# Patient Record
Sex: Male | Born: 1960 | Race: Black or African American | Hispanic: No | Smoking: Current every day smoker
Health system: Southern US, Community
[De-identification: ages and names within clinical notes are randomized; demographics above are authoritative.]

## PROBLEM LIST (undated history)

## (undated) DIAGNOSIS — K259 Gastric ulcer, unspecified as acute or chronic, without hemorrhage or perforation: Secondary | ICD-10-CM

---

## 2016-07-18 ENCOUNTER — Emergency Department (HOSPITAL_COMMUNITY)
Admission: EM | Admit: 2016-07-18 | Discharge: 2016-07-18 | Disposition: A | Discharge status: Home / Self Care | Payer: Self-pay | Attending: Emergency Medicine | Admitting: Emergency Medicine

## 2016-07-18 ENCOUNTER — Encounter (HOSPITAL_COMMUNITY): Payer: Self-pay | Admitting: Emergency Medicine

## 2016-07-18 ENCOUNTER — Emergency Department (HOSPITAL_COMMUNITY): Payer: Self-pay

## 2016-07-18 DIAGNOSIS — S43401A Unspecified sprain of right shoulder joint, initial encounter: Secondary | ICD-10-CM | POA: Insufficient documentation

## 2016-07-18 DIAGNOSIS — W108XXA Fall (on) (from) other stairs and steps, initial encounter: Secondary | ICD-10-CM | POA: Insufficient documentation

## 2016-07-18 DIAGNOSIS — Y999 Unspecified external cause status: Secondary | ICD-10-CM | POA: Insufficient documentation

## 2016-07-18 DIAGNOSIS — Y9289 Other specified places as the place of occurrence of the external cause: Secondary | ICD-10-CM | POA: Insufficient documentation

## 2016-07-18 DIAGNOSIS — Y9389 Activity, other specified: Secondary | ICD-10-CM | POA: Insufficient documentation

## 2016-07-18 HISTORY — DX: Gastric ulcer, unspecified as acute or chronic, without hemorrhage or perforation: K25.9

## 2016-07-18 NOTE — ED Provider Notes (Signed)
[facility]-EMERGENCY DEPT Provider Note   CSN: [id number] Arrival date & time: 07/18/16  1046  By signing my name below, I, Rosana Fret, attest that this documentation has been prepared under the direction and in the presence of non-physician practitioner, Jaynie Crumble, PA-C. Electronically Signed: Rosana Fret, ED Scribe. 07/18/16. 1:09 PM.  History   Chief Complaint Chief Complaint  Patient presents with  . Fall   The history is provided by the patient. No language interpreter was used.   HPI Comments: Tristan Johnson is a 56 y.o. male who presents to the Emergency Department complaining of a sudden onset, mechanical fall that occurred just prior to arrival. No LOC or head injury. 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 Pt's only complaint is right shoulder pain. Per pt, his pain is exacerbated by movement of his right arm above his head. No treatments tried prior to arrival in the ED. 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 No other complaints at this time. Not anticoagulated. States was able to finish moving the dresser after injury.   Past Medical History:  Diagnosis Date  . Gastric ulcer     There are no active problems to display for this patient.   History reviewed. No pertinent surgical history.     Home Medications    Prior to Admission medications   Not on File    Family History No family history on file.  Social History Social History  Substance Use Topics  . Smoking status: Not on file  . Smokeless tobacco: Not on file  . Alcohol use Not on file     Allergies   Ibuprofen   Review of Systems Review of Systems  Musculoskeletal: Positive for arthralgias and myalgias. Negative for back pain and neck pain.  Neurological: Negative for dizziness and headaches.  All other systems reviewed and are negative.    Physical Exam Updated Vital Signs BP 139/87 (BP Location: Left Arm)   Pulse 64    Temp 98.2 F (36.8 C) (Oral)   Resp 17   SpO2 97%   Physical Exam  Constitutional: He is oriented to person, place, and time. He appears well-developed and well-nourished.  HENT:  Head: Normocephalic and atraumatic.  Neck: Normal range of motion. Neck supple.  Cardiovascular: Normal rate.   Pulmonary/Chest: Effort normal.  Musculoskeletal: Normal range of motion.  Normal appearing right shoulder with no deformity. Superficial abrasion to the upper posterior arm. Pain with palaption over posterior shoulder joint. No tenderness over clavicle, acromion, anterior shoulder. Normal elbow. No ttp over tricep or bicep. Strength of tricep and bicep intact. Pain with ROM of shoulder. Distal radial pulses intact  Neurological: He is alert and oriented to person, place, and time.  Skin: Skin is warm and dry.  Psychiatric: He has a normal mood and affect.  Nursing note and vitals reviewed.    ED Treatments / Results  DIAGNOSTIC STUDIES: Oxygen Saturation is 97% on RA, normal by my interpretation.   COORDINATION OF CARE: 1:07 PM-Discussed next steps with pt including rest, ice the area and follow up with ortho if it does not improve. Pt verbalized understanding and is agreeable with the plan.   Labs (all labs ordered are listed, but only abnormal results are displayed) Labs Reviewed - No data to display  EKG  EKG Interpretation None       Radiology Dg Humerus Right  Result Date: 07/18/2016 CLINICAL DATA:  56 year old male status  post fall down flight of stairs. Right arm pain. EXAM: RIGHT HUMERUS - 2+ VIEW COMPARISON:  None. FINDINGS: Normal bone mineralization. Alignment about the right shoulder and elbow is preserved. The right humerus is intact. No acute osseous abnormality identified. Incidental right chest wall piercing. IMPRESSION: No acute fracture or dislocation identified about the right humerus. Electronically Signed   By: Odessa FlemingH  Hall M.D.   On: 07/18/2016 11:40     Procedures Procedures (including critical care time)  Medications Ordered in ED Medications - No data to display   Initial Impression / Assessment and Plan / ED Course  I have reviewed the triage vital signs and the nursing notes.  Pertinent labs & imaging results that were available during my care of the patient were reviewed by me and considered in my medical decision making (see chart for details).     Patient in emergency department after falling down several steps. He is only complaining of pain to the right shoulder. He did not hit his head or lost consciousness. He has no pain to his neck or back. He is not anticoagulated. No numbness or weakness down his right arm. X-Elmus is negative. We'll place him in a sling, home with NSAIDs, ice, rest, follow-up with orthopedics if not improving. Return precautions discussed.  Vitals:   07/18/16 1054 07/18/16 1302  BP: 139/87 (!) 138/93  Pulse: 64   Resp: 17   Temp: 98.2 F (36.8 C)   TempSrc: Oral   SpO2: 97%      Final Clinical Impressions(s) / ED Diagnoses   Final diagnoses:  Sprain of right shoulder, unspecified shoulder sprain type, initial encounter    New Prescriptions New Prescriptions   No medications on file   I personally performed the services described in this documentation, which was scribed in my presence. The recorded information has been reviewed and is accurate.    Jaynie CrumbleKirichenko, Pablo Mathurin, PA-C 07/18/16 1332    Tilden Fossaees, Elizabeth, MD 07/19/16 [phone]

## 2016-07-18 NOTE — Discharge Instructions (Signed)
Ice, rest, sling as needed. Tylenol for pain. Follow up with orthopedics as needed.

## 2016-07-18 NOTE — ED Notes (Addendum)
Sling applied to R arm

## 2016-07-18 NOTE — ED Triage Notes (Addendum)
Pt reports falling down a flight of stairs when the dolly he was using slipped. C/o right upper arm pain. Unable to lift shoulder, no obvious deformity noted. Pt denies hitting his head or passing out.

## 2019-02-06 ENCOUNTER — Encounter (HOSPITAL_COMMUNITY): Payer: Self-pay | Admitting: Emergency Medicine

## 2019-02-06 ENCOUNTER — Emergency Department (HOSPITAL_COMMUNITY): Payer: Self-pay

## 2019-02-06 ENCOUNTER — Other Ambulatory Visit: Payer: Self-pay

## 2019-02-06 ENCOUNTER — Emergency Department (HOSPITAL_COMMUNITY)
Admission: EM | Admit: 2019-02-06 | Discharge: 2019-02-06 | Disposition: A | Discharge status: Home / Self Care | Payer: Self-pay | Attending: Emergency Medicine | Admitting: Emergency Medicine

## 2019-02-06 DIAGNOSIS — Z79899 Other long term (current) drug therapy: Secondary | ICD-10-CM | POA: Insufficient documentation

## 2019-02-06 DIAGNOSIS — F17210 Nicotine dependence, cigarettes, uncomplicated: Secondary | ICD-10-CM | POA: Insufficient documentation

## 2019-02-06 DIAGNOSIS — N201 Calculus of ureter: Secondary | ICD-10-CM | POA: Insufficient documentation

## 2019-02-06 LAB — URINALYSIS, ROUTINE W REFLEX MICROSCOPIC
Bacteria, UA: NONE SEEN
Bilirubin Urine: NEGATIVE
Glucose, UA: NEGATIVE mg/dL
Ketones, ur: NEGATIVE mg/dL
Leukocytes,Ua: NEGATIVE
Nitrite: NEGATIVE
Protein, ur: 30 mg/dL — AB
RBC / HPF: >50 RBC/hpf — ABNORMAL HIGH (ref 0–5)
Specific Gravity, Urine: 1.018 (ref 1.005–1.030)
pH: 6 (ref 5.0–8.0)

## 2019-02-06 LAB — COMPREHENSIVE METABOLIC PANEL
ALT: 13 U/L (ref 0–44)
AST: 18 U/L (ref 15–41)
Albumin: 4.1 g/dL (ref 3.5–5.0)
Alkaline Phosphatase: 72 U/L (ref 38–126)
Anion gap: 12 (ref 5–15)
BUN: 10 mg/dL (ref 6–20)
CO2: 22 mmol/L (ref 22–32)
Calcium: 8.8 mg/dL — ABNORMAL LOW (ref 8.9–10.3)
Chloride: 107 mmol/L (ref 98–111)
Creatinine, Ser: 1.05 mg/dL (ref 0.61–1.24)
GFR calc Af Amer: >60 mL/min (ref >60)
GFR calc non Af Amer: >60 mL/min (ref >60)
Glucose, Bld: 129 mg/dL — ABNORMAL HIGH (ref 70–99)
Potassium: 4 mmol/L (ref 3.5–5.1)
Sodium: 141 mmol/L (ref 135–145)
Total Bilirubin: 0.7 mg/dL (ref 0.3–1.2)
Total Protein: 8 g/dL (ref 6.5–8.1)

## 2019-02-06 LAB — CBC
HCT: 43.7 % (ref 39.0–52.0)
Hemoglobin: 14.3 g/dL (ref 13.0–17.0)
MCH: 30.8 pg (ref 26.0–34.0)
MCHC: 32.7 g/dL (ref 30.0–36.0)
MCV: 94.2 fL (ref 80.0–100.0)
Platelets: 309 10*3/uL (ref 150–400)
RBC: 4.64 MIL/uL (ref 4.22–5.81)
RDW: 12.7 % (ref 11.5–15.5)
WBC: 9.4 10*3/uL (ref 4.0–10.5)
nRBC: 0 % (ref 0.0–0.2)

## 2019-02-06 LAB — LIPASE, BLOOD: Lipase: 23 U/L (ref 11–51)

## 2019-02-06 MED ORDER — SODIUM CHLORIDE 0.9% FLUSH: 3.0000 mL | Freq: Once | INTRAVENOUS | Status: DC

## 2019-02-06 MED ORDER — IOHEXOL 300 MG/ML  SOLN
100.0000 mL | Freq: Once | INTRAMUSCULAR | Status: AC | PRN
  Administered 2019-02-06: 12:00:00 100 mL via INTRAVENOUS

## 2019-02-06 MED ORDER — OXYCODONE-ACETAMINOPHEN 5-325 MG PO TABS: 1.0000 | ORAL_TABLET | Freq: Four times a day (QID) | ORAL | 0 refills | Status: DC | PRN

## 2019-02-06 MED ORDER — HYDROMORPHONE HCL 1 MG/ML IJ SOLN
1.0000 mg | Freq: Once | INTRAMUSCULAR | Status: DC
  Filled 2019-02-06: qty 1

## 2019-02-06 MED ORDER — MORPHINE SULFATE (PF) 4 MG/ML IV SOLN
4.0000 mg | Freq: Once | INTRAVENOUS | Status: AC
  Administered 2019-02-06: 4 mg via INTRAVENOUS
  Filled 2019-02-06: qty 1

## 2019-02-06 MED ORDER — ONDANSETRON HCL 4 MG/2ML IJ SOLN
4.0000 mg | Freq: Once | INTRAMUSCULAR | Status: AC
Start: 2019-02-06 — End: 2019-02-06
  Administered 2019-02-06: 14:00:00 4 mg via INTRAVENOUS
  Filled 2019-02-06: qty 2

## 2019-02-06 MED ORDER — TAMSULOSIN HCL 0.4 MG PO CAPS: 0.4000 mg | ORAL_CAPSULE | Freq: Every day | ORAL | 0 refills | Status: DC

## 2019-02-06 MED ORDER — OXYCODONE-ACETAMINOPHEN 5-325 MG PO TABS
2.0000 | ORAL_TABLET | Freq: Once | ORAL | Status: AC
  Administered 2019-02-06: 2 via ORAL
  Filled 2019-02-06: qty 2

## 2019-02-06 MED ORDER — ONDANSETRON HCL 4 MG/2ML IJ SOLN
4.0000 mg | Freq: Once | INTRAMUSCULAR | Status: AC
  Administered 2019-02-06: 4 mg via INTRAVENOUS
  Filled 2019-02-06: qty 2

## 2019-02-06 MED ORDER — SODIUM CHLORIDE 0.9 % IV BOLUS
1000.0000 mL | Freq: Once | INTRAVENOUS | Status: AC
  Administered 2019-02-06: 10:00:00 1000 mL via INTRAVENOUS

## 2019-02-06 MED ORDER — ONDANSETRON 8 MG PO TBDP: 8.0000 mg | ORAL_TABLET | Freq: Three times a day (TID) | ORAL | 0 refills | Status: DC | PRN

## 2019-02-06 NOTE — ED Triage Notes (Signed)
Pt c/o LLQ pain that started yesterday after eating. Denies nausea/vomiting/diarrhea. Also c/o blood in urine.

## 2019-02-06 NOTE — Discharge Instructions (Addendum)
°  Kidney Stone There is evidence of a kidney stone on the left side.  It appears as though it is on its way out.  Some kidney stones can take up to 30 days to pass, however, some are too large to pass. Hydration: Hydration is key to helping a kidney stone pass.  Have a goal of half a liter of water every hour or two. Acetaminophen: May take acetaminophen (generic for Tylenol), as needed, for pain. Your daily total maximum amount of acetaminophen from all sources should be limited to 4000mg /day for persons without liver problems, or 2000mg /day for those with liver problems. Percocet: May take Percocet (oxycodone-acetaminophen) as needed for severe pain.   Do not drive or perform other dangerous activities while taking this medication as it can cause drowsiness as well as changes in reaction time and judgement.  Please note that each pill of Percocet contains 325 mg of acetaminophen (generic for Tylenol) and the above dosage limits apply. Tamsulosin: This medication is designed to help the stone pass.  Take this medication daily until stone passes. Nausea/vomiting: Use the ondansetron (generic for Zofran) for nausea or vomiting.  This medication may not prevent all vomiting or nausea, but can help facilitate better hydration. Things that can help with nausea/vomiting also include peppermint/menthol candies, vitamin B12, and ginger. Follow-up: Follow-up with the urologist as soon as possible on this matter. Return: Return to the ED for significantly increased pain, difficulty urinating, pain with urination, fever, uncontrolled vomiting, or any other major concerns.  For prescription assistance, may try using prescription discount sites or apps, such as goodrx.com

## 2019-02-06 NOTE — ED Notes (Signed)
Pt stated cannot provide urine sample at this time.

## 2019-02-06 NOTE — ED Provider Notes (Signed)
[facility] EMERGENCY DEPARTMENT Provider Note   CSN: [id number] Arrival date & time: 02/06/19  0347     History Chief Complaint  Patient presents with  . Abdominal Pain    Tristan Johnson is a 58 y.o. male.  HPI     Tristan Johnson is a 58 y.o. male, with a history of PUD, presenting to the ED with abdominal pain beginning last night.  Patient states while eating he began to have sudden, stabbing pain to the left side of the abdomen, severe, radiating to the pelvis and toward the left testicle.  Accompanied by nausea and vomiting when pain arises.  Pain lasted for couple hours and then subsided. He had at least 2 or 3 more episodes of this pattern of pain overnight and into this morning.  He states he has had pain in this region before, but the pain will typically subside after taking Nexium. This morning he also noted one episode of pink-tinged urine. Last bowel movement was yesterday morning and was normal. Denies regular alcohol use, NSAID use.  Denies anticoagulation.  Denies fever/chills, hematemesis, hematochezia/melena, diarrhea, dysuria, persistent genital pain, genital swelling, chest pain, shortness of breath, neuro deficits, or any other complaints.   Past Medical History:  Diagnosis Date  . Gastric ulcer     There are no problems to display for this patient.   History reviewed. No pertinent surgical history.     No family history on file.  Social History   Tobacco Use  . Smoking status: Current Every Day Smoker  . Smokeless tobacco: Never Used  Substance Use Topics  . Alcohol use: Yes  . Drug use: Not Currently    Home Medications Prior to Admission medications   Medication Sig Start Date End Date Taking? Authorizing Provider  esomeprazole (NEXIUM) 20 MG capsule Take 20 mg by mouth daily at 12 noon.   Yes [provider]  ondansetron (ZOFRAN ODT) 8 MG disintegrating tablet Take 1 tablet (8 mg total) by mouth every 8 (eight)  hours as needed for nausea or vomiting. 02/06/19   Oliver Neuwirth C, PA-C  oxyCODONE-acetaminophen (PERCOCET/ROXICET) 5-325 MG tablet Take 1-2 tablets by mouth every 6 (six) hours as needed for severe pain. 02/06/19   Shayma Pfefferle C, PA-C  tamsulosin (FLOMAX) 0.4 MG CAPS capsule Take 1 capsule (0.4 mg total) by mouth daily. 02/06/19   Jaydeen Odor C, PA-C    Allergies    Ibuprofen  Review of Systems   Review of Systems  Constitutional: Negative for chills, diaphoresis and fever.  Respiratory: Negative for cough and shortness of breath.   Cardiovascular: Negative for chest pain and leg swelling.  Gastrointestinal: Positive for abdominal pain, nausea and vomiting. Negative for blood in stool and diarrhea.  Genitourinary: Positive for flank pain and hematuria. Negative for difficulty urinating, discharge, dysuria, scrotal swelling and testicular pain.  Musculoskeletal: Negative for back pain.  Neurological: Negative for dizziness, weakness, light-headedness and numbness.  All other systems reviewed and are negative.   Physical Exam Updated Vital Signs BP (!) 159/95 (BP Location: Left Arm)   Pulse 73   Temp 98.4 F (36.9 C) (Oral)   Resp 18   SpO2 100%   Physical Exam Vitals and nursing note reviewed.  Constitutional:      General: He is not in acute distress.    Appearance: He is well-developed. He is not diaphoretic.  HENT:     Head: Normocephalic and atraumatic.     Mouth/Throat:  Mouth: Mucous membranes are moist.     Pharynx: Oropharynx is clear.  Eyes:     Conjunctiva/sclera: Conjunctivae normal.  Cardiovascular:     Rate and Rhythm: Normal rate and regular rhythm.     Pulses: Normal pulses.          Radial pulses are 2+ on the right side and 2+ on the left side.       Posterior tibial pulses are 2+ on the right side and 2+ on the left side.     Heart sounds: Normal heart sounds.     Comments: Tactile temperature in the extremities appropriate and equal  bilaterally. Pulmonary:     Effort: Pulmonary effort is normal. No respiratory distress.     Breath sounds: Normal breath sounds.  Abdominal:     Palpations: Abdomen is soft.     Tenderness: There is abdominal tenderness. There is no right CVA tenderness, left CVA tenderness or guarding.    Musculoskeletal:     Cervical back: Neck supple.     Right lower leg: No edema.     Left lower leg: No edema.  Lymphadenopathy:     Cervical: No cervical adenopathy.  Skin:    General: Skin is warm and dry.  Neurological:     Mental Status: He is alert.     Comments: Sensation grossly intact to light touch in the extremities.  Grip strengths equal bilaterally.  Strength 5/5 in all extremities. No gait disturbance. Coordination intact. Cranial nerves III-XII grossly intact. No facial droop.   Psychiatric:        Mood and Affect: Mood and affect normal.        Speech: Speech normal.        Behavior: Behavior normal.     ED Results / Procedures / Treatments   Labs (all labs ordered are listed, but only abnormal results are displayed) Labs Reviewed  COMPREHENSIVE METABOLIC PANEL - Abnormal; Notable for the following components:      Result Value   Glucose, Bld 129 (*)    Calcium 8.8 (*)    All other components within normal limits  URINALYSIS, ROUTINE W REFLEX MICROSCOPIC - Abnormal; Notable for the following components:   APPearance HAZY (*)    Hgb urine dipstick LARGE (*)    Protein, ur 30 (*)    RBC / HPF >50 (*)    All other components within normal limits  URINE CULTURE  LIPASE, BLOOD  CBC    EKG None  Radiology CT ABDOMEN PELVIS W CONTRAST  Result Date: 02/06/2019 CLINICAL DATA:  Abdominal pain. Nausea and vomiting. Left lower quadrant and left flank pain. EXAM: CT ABDOMEN AND PELVIS WITH CONTRAST TECHNIQUE: Multidetector CT imaging of the abdomen and pelvis was performed using the standard protocol following bolus administration of intravenous contrast. CONTRAST:   OMNIPAQUE IOHEXOL 300 MG/ML  SOLN COMPARISON:  None. FINDINGS: Lower chest: Unremarkable. Hepatobiliary: 10 mm low-density lesion lateral segment left liver is compatible with a cyst. Several other scattered tiny hepatic hypodensities are too small to characterize. There is no evidence for gallstones, gallbladder wall thickening, or pericholecystic fluid. No intrahepatic or extrahepatic biliary dilation. Pancreas: No focal mass lesion. No dilatation of the main duct. No intraparenchymal cyst. No peripancreatic edema. Spleen: No splenomegaly. No focal mass lesion. Adrenals/Urinary Tract: No adrenal nodule or mass. 4.1 cm cyst identified upper pole right kidney. Right ureter unremarkable. Differentially decreased perfusion noted to the left kidney. Delayed contrast excretion noted left kidney. 2.1 cm  cyst identified lower pole left kidney. Additional small hypodensities in the left kidney cannot be definitively characterized but are probably cysts. Mild fullness noted left intrarenal collecting system and ureter. Left ureter remains mildly distended down to the level of the left internal iliac artery where a 12 x 6 x 4 mm ureteral stone is identified (axial 57/3 and well demonstrated sagittal 72/7). No bladder stones. Stomach/Bowel: Stomach is unremarkable. No gastric wall thickening. No evidence of outlet obstruction. Duodenum is normally positioned as is the ligament of Treitz. No small bowel wall thickening. No small bowel dilatation. The terminal ileum is normal. The appendix is normal. No gross colonic mass. No colonic wall thickening. Diverticular changes are noted in the left colon without evidence of diverticulitis. Vascular/Lymphatic: There is abdominal aortic atherosclerosis without aneurysm. There is no gastrohepatic or hepatoduodenal ligament lymphadenopathy. No retroperitoneal or mesenteric lymphadenopathy. No pelvic sidewall lymphadenopathy. Reproductive: The prostate gland and seminal vesicles are  unremarkable. Other: No intraperitoneal free fluid. Musculoskeletal: No worrisome lytic or sclerotic osseous abnormality. Degenerative disc disease noted L5-S1. IMPRESSION: 1. 12 x 6 x 4 mm distal left ureteral stone causes mild left hydroureteronephrosis with evidence of obstructive uropathy. No other urinary stone disease. 2. Hepatic and renal cysts. Electronically Signed   By: Kennith CenterEric  Mansell M.D.   On: 02/06/2019 12:40    Procedures Procedures (including critical care time)  Medications Ordered in ED Medications  sodium chloride flush (NS) 0.9 % injection 3 mL (3 mLs Intravenous Not Given 02/06/19 1012)  sodium chloride 0.9 % bolus 1,000 mL (0 mLs Intravenous Stopped 02/06/19 1050)  ondansetron (ZOFRAN) injection 4 mg (4 mg Intravenous Given 02/06/19 1222)  iohexol (OMNIPAQUE) 300 MG/ML solution 100 mL (100 mLs Intravenous Contrast Given 02/06/19 1159)  morphine 4 MG/ML injection 4 mg (4 mg Intravenous Given 02/06/19 1223)  ondansetron (ZOFRAN) injection 4 mg (4 mg Intravenous Given 02/06/19 1406)  oxyCODONE-acetaminophen (PERCOCET/ROXICET) 5-325 MG per tablet 2 tablet (2 tablets Oral Given 02/06/19 1416)    ED Course  I have reviewed the triage vital signs and the nursing notes.  Pertinent labs & imaging results that were available during my care of the patient were reviewed by me and considered in my medical decision making (see chart for details).  Clinical Course as of Feb 05 1506  Sat Feb 06, 2019  1351 Spoke with Dr. Ronne BinningMcKenzie, urologist.  Discussed patient's symptoms and CT findings.  States they can see the patient in the office if we are able to get his pain under control. Call back if unable to do so.   [SJ]  1503 Patient continues to be comfortable with pain well controlled.  He was able to tolerate drinking fluids without vomiting or onset of nausea.   [SJ]    Clinical Course User Index [SJ] Reily Ilic C, PA-C   MDM Rules/Calculators/A&P                      Patient  presents with flank and abdominal pain beginning last night.  Accompanied by nausea and vomiting. Patient is nontoxic appearing, afebrile, not tachycardic, not tachypneic, not hypotensive, maintains excellent SPO2 on room air. Lab work overall reassuring.  12 mm left distal ureteral stone noted on CT. Pain and nausea well controlled here in the ED.  Tolerating PO fluids at time of discharge.  Urology follow-up in the office. The patient was given instructions for home care as well as return precautions. Patient voices understanding of these instructions, accepts the  plan, and is comfortable with discharge.   Findings and plan of care discussed with Lajean Saver, MD. Dr. Ashok Cordia personally evaluated and examined this patient.  Vitals:   02/06/19 0400 02/06/19 0608  BP: (!) 137/105 (!) 159/95  Pulse: 95 73  Resp: 18 18  Temp: 98 F (36.7 C) 98.4 F (36.9 C)  TempSrc: Oral Oral  SpO2: 98% 100%     Final Clinical Impression(s) / ED Diagnoses Final diagnoses:  Left ureteral stone    Rx / DC Orders ED Discharge Orders         Ordered    ondansetron (ZOFRAN ODT) 8 MG disintegrating tablet  Every 8 hours PRN     02/06/19 1415    tamsulosin (FLOMAX) 0.4 MG CAPS capsule  Daily     02/06/19 1415    oxyCODONE-acetaminophen (PERCOCET/ROXICET) 5-325 MG tablet  Every 6 hours PRN     02/06/19 1506           Lorayne Bender, PA-C 02/06/19 1525    Lajean Saver, MD 02/07/19 1011

## 2019-02-07 LAB — URINE CULTURE: Culture: NO GROWTH

## 2019-07-28 ENCOUNTER — Ambulatory Visit: Payer: Self-pay

## 2019-07-28 DIAGNOSIS — Z23 Encounter for immunization: Secondary | ICD-10-CM

## 2019-07-28 NOTE — Progress Notes (Signed)
   Covid-19 Vaccination Clinic  Name:  Aundrea Higginbotham    MRN: [id number] DOB: 12/21/1960  07/28/2019  Mr. Monteleone was observed post Covid-19 immunization for 15 minutes without incident. He was provided with Vaccine Information Sheet and instruction to access the V-Safe system.   Mr. Sorter was instructed to call 911 with any severe reactions post vaccine: Marland Kitchen Difficulty breathing  . Swelling of face and throat  . A fast heartbeat  . A bad rash all over body  . Dizziness and weakness   Immunizations Administered    Name Date Dose VIS Date Route   Pfizer COVID-19 Vaccine 07/28/2019 11:41 AM 0.3 mL 04/07/2018 Intramuscular   Manufacturer: [employer], [employer]   Lot: [id number]   NDC: [address]-1

## 2019-08-25 ENCOUNTER — Ambulatory Visit: Payer: Self-pay | Attending: Internal Medicine

## 2019-08-25 DIAGNOSIS — Z23 Encounter for immunization: Secondary | ICD-10-CM

## 2019-08-25 NOTE — Progress Notes (Signed)
   Covid-19 Vaccination Clinic  Name:  Tristan Johnson    MRN: [id number] DOB: 10/27/1960  08/25/2019  Mr. Tristan Johnson was observed post Covid-19 immunization for 15 minutes without incident. He was provided with Vaccine Information Sheet and instruction to access the V-Safe system.   Mr. Tristan Johnson was instructed to call 911 with any severe reactions post vaccine: Marland Kitchen Difficulty breathing  . Swelling of face and throat  . A fast heartbeat  . A bad rash all over body  . Dizziness and weakness   Immunizations Administered    Name Date Dose VIS Date Route   Pfizer COVID-19 Vaccine 08/25/2019 11:53 AM 0.3 mL 04/07/2018 Intramuscular   Manufacturer: [employer], [employer]   Lot: [id number]   NDC: [address]-5

## 2019-09-01 ENCOUNTER — Emergency Department (HOSPITAL_COMMUNITY): Payer: Self-pay

## 2019-09-01 ENCOUNTER — Emergency Department (HOSPITAL_COMMUNITY)
Admission: EM | Admit: 2019-09-01 | Discharge: 2019-09-01 | Disposition: A | Discharge status: Home / Self Care | Payer: Self-pay | Attending: Emergency Medicine | Admitting: Emergency Medicine

## 2019-09-01 ENCOUNTER — Encounter (HOSPITAL_COMMUNITY): Payer: Self-pay | Admitting: Emergency Medicine

## 2019-09-01 DIAGNOSIS — N200 Calculus of kidney: Secondary | ICD-10-CM | POA: Insufficient documentation

## 2019-09-01 DIAGNOSIS — N50812 Left testicular pain: Secondary | ICD-10-CM | POA: Insufficient documentation

## 2019-09-01 DIAGNOSIS — F17200 Nicotine dependence, unspecified, uncomplicated: Secondary | ICD-10-CM | POA: Insufficient documentation

## 2019-09-01 LAB — COMPREHENSIVE METABOLIC PANEL
ALT: 15 U/L (ref 0–44)
AST: 21 U/L (ref 15–41)
Albumin: 3.9 g/dL (ref 3.5–5.0)
Alkaline Phosphatase: 73 U/L (ref 38–126)
Anion gap: 8 (ref 5–15)
BUN: 8 mg/dL (ref 6–20)
CO2: 25 mmol/L (ref 22–32)
Calcium: 9.5 mg/dL (ref 8.9–10.3)
Chloride: 106 mmol/L (ref 98–111)
Creatinine, Ser: 1.12 mg/dL (ref 0.61–1.24)
GFR calc Af Amer: >60 mL/min (ref >60)
GFR calc non Af Amer: >60 mL/min (ref >60)
Glucose, Bld: 140 mg/dL — ABNORMAL HIGH (ref 70–99)
Potassium: 4.1 mmol/L (ref 3.5–5.1)
Sodium: 139 mmol/L (ref 135–145)
Total Bilirubin: 1.1 mg/dL (ref 0.3–1.2)
Total Protein: 8.1 g/dL (ref 6.5–8.1)

## 2019-09-01 LAB — CBC
HCT: 44.4 % (ref 39.0–52.0)
Hemoglobin: 14.2 g/dL (ref 13.0–17.0)
MCH: 30.3 pg (ref 26.0–34.0)
MCHC: 32 g/dL (ref 30.0–36.0)
MCV: 94.9 fL (ref 80.0–100.0)
Platelets: 292 10*3/uL (ref 150–400)
RBC: 4.68 MIL/uL (ref 4.22–5.81)
RDW: 12.5 % (ref 11.5–15.5)
WBC: 5.6 10*3/uL (ref 4.0–10.5)
nRBC: 0 % (ref 0.0–0.2)

## 2019-09-01 LAB — URINALYSIS, ROUTINE W REFLEX MICROSCOPIC
Bilirubin Urine: NEGATIVE
Glucose, UA: NEGATIVE mg/dL
Hgb urine dipstick: NEGATIVE
Ketones, ur: NEGATIVE mg/dL
Leukocytes,Ua: NEGATIVE
Nitrite: NEGATIVE
Protein, ur: NEGATIVE mg/dL
Specific Gravity, Urine: 1.021 (ref 1.005–1.030)
pH: 6 (ref 5.0–8.0)

## 2019-09-01 LAB — LIPASE, BLOOD: Lipase: 27 U/L (ref 11–51)

## 2019-09-01 MED ORDER — OXYCODONE-ACETAMINOPHEN 5-325 MG PO TABS: 1.0000 | ORAL_TABLET | Freq: Three times a day (TID) | ORAL | 0 refills | Status: DC | PRN

## 2019-09-01 MED ORDER — SODIUM CHLORIDE 0.9% FLUSH: 3.0000 mL | Freq: Once | INTRAVENOUS | Status: DC

## 2019-09-01 MED ORDER — ONDANSETRON HCL 4 MG/2ML IJ SOLN: 4.0000 mg | Freq: Once | INTRAMUSCULAR | Status: DC

## 2019-09-01 MED ORDER — KETOROLAC TROMETHAMINE 30 MG/ML IJ SOLN
30.0000 mg | Freq: Once | INTRAMUSCULAR | Status: AC
  Administered 2019-09-01: 30 mg via INTRAMUSCULAR
  Filled 2019-09-01: qty 1

## 2019-09-01 MED ORDER — TAMSULOSIN HCL 0.4 MG PO CAPS: 0.4000 mg | ORAL_CAPSULE | Freq: Every day | ORAL | 0 refills | Status: DC

## 2019-09-01 MED ORDER — MORPHINE SULFATE (PF) 4 MG/ML IV SOLN: 4.0000 mg | Freq: Once | INTRAVENOUS | Status: DC

## 2019-09-01 NOTE — Discharge Instructions (Signed)
You have been diagnosed with a 7 x 10 mm kidney stone on the left side causing pain.  You will likely able to pass the stone in the next few days.  Take medication prescribed as needed for pain.  Use the urine strainer to capture the stone and bring it to urology for further evaluation.  Drink plenty of fluid.  Return if you have any concern.

## 2019-09-01 NOTE — ED Triage Notes (Signed)
Pt reports pain to his testicles and lower abdomen that began while he was working on a car earlier, denies any heavy lifting, pushing or pulling. Denies any swelling.

## 2019-09-01 NOTE — ED Provider Notes (Signed)
59 year old male remote history of kidney stones presenting with acute onset of left testicular pain.  Pain started approximately 2 hours ago while he was working on a car.  On exam with chaperone present, he has an uncircumcised penis free of lesion or rash, no obvious inguinal lymphadenopathy or inguinal hernia noted.  Tenderness to left testicle more significant to the epididymal region.  Testes with normal lie and normal cremasteric reflex.  No scrotal swelling.  Left CVA tenderness.  Abdomen nontender.  MSE was initiated and I personally evaluated the patient and placed orders (if any) at  12:39 PM on September 01, 2019.  Scrotal ultrasound ordered to rule out potential testicular torsion.  However if negative, patient would likely benefit from CT renal stone study.  The patient appears stable so that the remainder of the MSE may be completed by another provider.  BP (!) 144/101   Pulse 75   Temp (!) 97.4 F (36.3 C)   Resp 20   SpO2 100%     Fayrene Helper, PA-C 09/01/19 1243    Alvira Monday, MD 09/01/19 2123

## 2019-09-01 NOTE — ED Provider Notes (Signed)
[facility] EMERGENCY DEPARTMENT Provider Note   CSN: [id number] Arrival date & time: 09/01/19  1207     History Chief Complaint  Patient presents with  . Testicle Pain  . Abdominal Pain    Tristan Johnson is a 59 y.o. male.  The history is provided by the patient and medical records. No language interpreter was used.  Testicle Pain Associated symptoms include abdominal pain.  Abdominal Pain    59 year old male remote history of kidney stones presenting with complaint of left testicular pain.  Patient reports 2 hours prior to arrival to the ED, patient was working on a call when he developed acute onset of pain to his left testicle.  Pain is sharp shooting intense and has been persistent.  He noticed some improvement with pain when he raises legs and bending at the hip.  Pain is associated with some nausea but no vomiting.  No trouble urinating and he has not noticed any blood in the urine.  He has not noticed any scrotal swelling.  No fever or chills no chest pain or shortness of breath.  He denies any heavy lifting, strenuous activities, recent injury, or any abnormal swelling.  He denies any specific treatment tried.  Past Medical History:  Diagnosis Date  . Gastric ulcer     There are no problems to display for this patient.   History reviewed. No pertinent surgical history.     No family history on file.  Social History   Tobacco Use  . Smoking status: Current Every Day Smoker  . Smokeless tobacco: Never Used  Substance Use Topics  . Alcohol use: Yes  . Drug use: Not Currently    Home Medications Prior to Admission medications   Medication Sig Start Date End Date Taking? Authorizing Provider  esomeprazole (NEXIUM) 20 MG capsule Take 20 mg by mouth daily at 12 noon.    [provider]  ondansetron (ZOFRAN ODT) 8 MG disintegrating tablet Take 1 tablet (8 mg total) by mouth every 8 (eight) hours as needed for nausea or vomiting. 02/06/19    Joy, Shawn C, PA-C  oxyCODONE-acetaminophen (PERCOCET/ROXICET) 5-325 MG tablet Take 1-2 tablets by mouth every 6 (six) hours as needed for severe pain. 02/06/19   Joy, Shawn C, PA-C  tamsulosin (FLOMAX) 0.4 MG CAPS capsule Take 1 capsule (0.4 mg total) by mouth daily. 02/06/19   Joy, Shawn C, PA-C    Allergies    Other, Tomato, Turkey-sweet potatoes-peaches [alimentum], and Ibuprofen  Review of Systems   Review of Systems  Gastrointestinal: Positive for abdominal pain.  Genitourinary: Positive for testicular pain.  All other systems reviewed and are negative.   Physical Exam Updated Vital Signs BP (!) 138/98 (BP Location: Right Arm)   Pulse 63   Temp (!) 97.4 F (36.3 C)   Resp 16   SpO2 100%   Physical Exam Vitals and nursing note reviewed. Exam conducted with a chaperone present.  Constitutional:      General: He is not in acute distress.    Appearance: He is well-developed.  HENT:     Head: Atraumatic.  Eyes:     Conjunctiva/sclera: Conjunctivae normal.  Cardiovascular:     Rate and Rhythm: Normal rate and regular rhythm.  Pulmonary:     Effort: Pulmonary effort is normal.     Breath sounds: Normal breath sounds.  Abdominal:     General: Abdomen is flat.     Palpations: Abdomen is soft.     Tenderness:  There is abdominal tenderness in the left lower quadrant. There is left CVA tenderness. There is no right CVA tenderness.     Hernia: No hernia is present.  Genitourinary:    Penis: Normal.      Testes: Cremasteric reflex is present.        Right: Mass, tenderness or swelling not present.        Left: Tenderness present. Mass or swelling not present.  Musculoskeletal:     Cervical back: Neck supple.  Skin:    Findings: No rash.  Neurological:     Mental Status: He is alert.  Psychiatric:        Mood and Affect: Mood normal.     ED Results / Procedures / Treatments   Labs (all labs ordered are listed, but only abnormal results are displayed) Labs Reviewed   COMPREHENSIVE METABOLIC PANEL - Abnormal; Notable for the following components:      Result Value   Glucose, Bld 140 (*)    All other components within normal limits  LIPASE, BLOOD  CBC  URINALYSIS, ROUTINE W REFLEX MICROSCOPIC    EKG None  Radiology CT Renal Stone Study  Result Date: 09/01/2019 CLINICAL DATA:  Flank and left testicle pain EXAM: CT ABDOMEN AND PELVIS WITHOUT CONTRAST TECHNIQUE: Multidetector CT imaging of the abdomen and pelvis was performed following the standard protocol without IV contrast. COMPARISON:  Scrotal ultrasound 09/01/2019, CT 02/06/2019 FINDINGS: Lower chest: No acute abnormality. Hepatobiliary: Small cyst in the left hepatic lobe. No calcified gallstone or biliary dilatation. Pancreas: Unremarkable. No pancreatic ductal dilatation or surrounding inflammatory changes. Spleen: Normal in size without focal abnormality. Adrenals/Urinary Tract: Adrenal glands are normal. Low-attenuation lesions in both kidneys likely related to cysts with additional subcentimeter hypodensities too small to further characterize. Mild left hydronephrosis and hydroureter, secondary to a 7 x 10 mm stone in the distal left ureter at or just proximal to the left UVJ. Mild left perinephric fat stranding. The bladder is normal. Stomach/Bowel: Stomach is within normal limits. Appendix appears normal. No evidence of bowel wall thickening, distention, or inflammatory changes. Left colon diverticular disease without acute inflammatory change. Vascular/Lymphatic: Mild aortic atherosclerosis. No aneurysm. No suspicious nodes. Reproductive: Prostate is unremarkable. Other: Negative for free air or free fluid. Musculoskeletal: Advanced degenerative change at L5-S1 with endplate sclerosis, disc space narrowing and osteophyte. IMPRESSION: 1. Mild left hydronephrosis and hydroureter, secondary to a 7 x 10 mm stone in the distal left ureter at or just proximal to the left UVJ. 2. Left colon diverticular  disease without acute inflammatory change. Aortic Atherosclerosis (ICD10-I70.0). Electronically Signed   By: Jasmine Pang M.D.   On: 09/01/2019 17:58   US SCROTUM W/DOPPLER  Result Date: 09/01/2019 CLINICAL DATA:  Left testicle pain EXAM: SCROTAL ULTRASOUND DOPPLER ULTRASOUND OF THE TESTICLES TECHNIQUE: Complete ultrasound examination of the testicles, epididymis, and other scrotal structures was performed. Color and spectral Doppler ultrasound were also utilized to evaluate blood flow to the testicles. COMPARISON:  None. FINDINGS: Right testicle Measurements: 4.5 x 2.3 x 2.6 cm. No mass or microlithiasis visualized. Left testicle Measurements: 4.1 x 2.3 x 3 cm. No mass or microlithiasis visualized. Right epididymis:  Normal in size and appearance. Left epididymis:  Normal in size and appearance. Hydrocele:  Small moderate right and small left hydroceles. Varicocele:  None visualized. Pulsed Doppler interrogation of both testes demonstrates normal low resistance arterial and venous waveforms bilaterally. IMPRESSION: 1. Negative for testicular torsion or testicular mass lesion. 2. Right greater than left  bilateral hydroceles. Electronically Signed   By: Jasmine Pang M.D.   On: 09/01/2019 15:22    Procedures Procedures (including critical care time)  Medications Ordered in ED Medications  sodium chloride flush (NS) 0.9 % injection 3 mL (has no administration in time range)  morphine 4 MG/ML injection 4 mg (has no administration in time range)  ondansetron (ZOFRAN) injection 4 mg (has no administration in time range)  ketorolac (TORADOL) 30 MG/ML injection 30 mg (has no administration in time range)    ED Course  I have reviewed the triage vital signs and the nursing notes.  Pertinent labs & imaging results that were available during my care of the patient were reviewed by me and considered in my medical decision making (see chart for details).    MDM Rules/Calculators/A&P                           BP (!) 154/88 (BP Location: Right Arm)   Pulse 62   Temp 98 F (36.7 C) (Oral)   Resp 18   SpO2 100%   Final Clinical Impression(s) / ED Diagnoses Final diagnoses:  Kidney stone on left side    Rx / DC Orders ED Discharge Orders         Ordered    oxyCODONE-acetaminophen (PERCOCET) 5-325 MG tablet  Every 8 hours PRN,   Status:  Discontinued     Reprint     09/01/19 1847    tamsulosin (FLOMAX) 0.4 MG CAPS capsule  Daily     Discontinue  Reprint     09/01/19 1847    oxyCODONE-acetaminophen (PERCOCET) 5-325 MG tablet  Every 8 hours PRN     Discontinue  Reprint     09/01/19 1850         6:24 PM Patient presents with acute onset of left testicular pain.  He does have history of kidney stones in the past.  Examination revealed tenderness to his left testicle without scrotal swelling and testicle with normal lie.  Given the potential for testicular torsion, a Doppler ultrasound of the scrotum was obtained and negative for torsion.  CT renal stone study was obtained which demonstrate mild left hydronephrosis and hydroureter secondary to a 7 x 10 mm stone at the distal UVJ consistent with patient's presenting complaint.  Pain medication given.  6:48 PM Improvement of symptoms after treatment.  Patient stable for discharge.  Return precaution discussed.   Fayrene Helper, PA-C 09/01/19 1850    Raeford Razor, MD 09/05/19 1530

## 2020-01-24 ENCOUNTER — Other Ambulatory Visit: Payer: Self-pay

## 2020-01-24 ENCOUNTER — Emergency Department (HOSPITAL_COMMUNITY)
Admission: EM | Admit: 2020-01-24 | Discharge: 2020-01-24 | Disposition: A | Discharge status: Home / Self Care | Payer: Self-pay | Attending: Emergency Medicine | Admitting: Emergency Medicine

## 2020-01-24 ENCOUNTER — Emergency Department (HOSPITAL_COMMUNITY): Payer: Self-pay

## 2020-01-24 DIAGNOSIS — M5441 Lumbago with sciatica, right side: Secondary | ICD-10-CM

## 2020-01-24 DIAGNOSIS — F17200 Nicotine dependence, unspecified, uncomplicated: Secondary | ICD-10-CM | POA: Insufficient documentation

## 2020-01-24 MED ORDER — DIAZEPAM 5 MG PO TABS
5.0000 mg | ORAL_TABLET | Freq: Once | ORAL | Status: AC
  Administered 2020-01-24: 11:00:00 5 mg via ORAL
  Filled 2020-01-24: qty 1

## 2020-01-24 MED ORDER — METHOCARBAMOL 500 MG PO TABS: 500.0000 mg | ORAL_TABLET | Freq: Two times a day (BID) | ORAL | 0 refills | Status: AC

## 2020-01-24 MED ORDER — PREDNISONE 20 MG PO TABS
40.0000 mg | ORAL_TABLET | Freq: Every day | ORAL | 0 refills | Status: AC
Start: 2020-01-24 — End: 2020-01-29

## 2020-01-24 MED ORDER — KETOROLAC TROMETHAMINE 15 MG/ML IJ SOLN
15.0000 mg | Freq: Once | INTRAMUSCULAR | Status: AC
  Administered 2020-01-24: 11:00:00 15 mg via INTRAMUSCULAR
  Filled 2020-01-24: qty 1

## 2020-01-24 MED ORDER — OXYCODONE HCL 5 MG PO TABS
5.0000 mg | ORAL_TABLET | Freq: Once | ORAL | Status: AC
  Administered 2020-01-24: 11:00:00 5 mg via ORAL
  Filled 2020-01-24: qty 1

## 2020-01-24 MED ORDER — ACETAMINOPHEN 500 MG PO TABS
1000.0000 mg | ORAL_TABLET | Freq: Once | ORAL | Status: AC
  Administered 2020-01-24: 11:00:00 1000 mg via ORAL
  Filled 2020-01-24: qty 2

## 2020-01-24 NOTE — Discharge Instructions (Signed)
I have prescribed a short amount of muscle relaxants to help with your pain. We were unable to provide you with NSAIDS due to your history of stomach ulcers.   I have provided a referral to Orthopedist as you will need further care for your back pain.

## 2020-01-24 NOTE — ED Triage Notes (Signed)
Pt reports R sided lower back pain with radiation to leg x 1 month. Back injury in 1994 and reports intermittent back problems since then.

## 2020-01-24 NOTE — ED Provider Notes (Signed)
[facility] EMERGENCY DEPARTMENT Provider Note   CSN: [id number] Arrival date & time: 01/24/20  8938     History No chief complaint on file.   Tristan Johnson is a 59 y.o. male.  59 year old male with no prior medical history presents to the ED with a chief complaint of right-sided back pain x1 month.  According to patient he had a back injury back in 1994, this seems to be exacerbated during the winter months.  He describes that her right sided lumbar spine pain radiating down his right leg.  Has tried heating pads without any improvement in symptoms.  Exacerbated with movement and at night.  Does get relieved with pressure to the right side.  No history of IV drug use, fever, bowel or bladder complaints, no prior history of cancer.   The history is provided by the patient.  Back Pain Location:  Lumbar spine Radiates to:  R thigh Pain severity:  Mild Pain is:  Worse during the night Onset quality:  Gradual Duration:  1 month Timing:  Constant Progression:  Worsening Chronicity:  Recurrent Context: not falling   Relieved by:  Nothing Worsened by:  Movement Ineffective treatments:  Heating pad Associated symptoms: no abdominal pain, no bladder incontinence, no chest pain, no dysuria, no fever, no leg pain, no numbness and no tingling   Risk factors: no hx of cancer, not obese, not pregnant, no steroid use and no vascular disease        Past Medical History:  Diagnosis Date  . Gastric ulcer     There are no problems to display for this patient.   No past surgical history on file.     No family history on file.  Social History   Tobacco Use  . Smoking status: Current Every Day Smoker  . Smokeless tobacco: Never Used  Substance Use Topics  . Alcohol use: Yes  . Drug use: Not Currently    Home Medications Prior to Admission medications   Medication Sig Start Date End Date Taking? Authorizing Provider  esomeprazole (NEXIUM) 20 MG capsule Take  20 mg by mouth daily at 12 noon.    [provider]  methocarbamol (ROBAXIN) 500 MG tablet Take 1 tablet (500 mg total) by mouth 2 (two) times daily for 10 days. 01/24/20 02/03/20  Claude Manges, PA-C  ondansetron (ZOFRAN ODT) 8 MG disintegrating tablet Take 1 tablet (8 mg total) by mouth every 8 (eight) hours as needed for nausea or vomiting. 02/06/19   Joy, Shawn C, PA-C  oxyCODONE-acetaminophen (PERCOCET) 5-325 MG tablet Take 1 tablet by mouth every 8 (eight) hours as needed for severe pain. 09/01/19   Fayrene Helper, PA-C  predniSONE (DELTASONE) 20 MG tablet Take 2 tablets (40 mg total) by mouth daily for 5 days. 01/24/20 01/29/20  Claude Manges, PA-C  tamsulosin (FLOMAX) 0.4 MG CAPS capsule Take 1 capsule (0.4 mg total) by mouth daily. 09/01/19   Fayrene Helper, PA-C    Allergies    Other, Tomato, Turkey-sweet potatoes-peaches [alimentum], and Ibuprofen  Review of Systems   Review of Systems  Constitutional: Negative for fever.  Cardiovascular: Negative for chest pain.  Gastrointestinal: Negative for abdominal pain.  Genitourinary: Negative for bladder incontinence and dysuria.  Musculoskeletal: Positive for back pain.  Neurological: Negative for tingling and numbness.    Physical Exam Updated Vital Signs BP (!) 149/92 (BP Location: Right Arm)   Pulse 82   Temp 98.3 F (36.8 C) (Oral)   Resp 18  Ht 5\' 4"  (1.626 m)   Wt 72.6 kg   SpO2 100%   BMI 27.46 kg/m   Physical Exam Vitals and nursing note reviewed.  Constitutional:      Appearance: Normal appearance.  HENT:     Head: Normocephalic and atraumatic.  Pulmonary:     Effort: Pulmonary effort is normal.  Musculoskeletal:     Cervical back: Normal range of motion and neck supple.     Comments: RLE- KF,KE 5/5 strength LLE- HF, HE 5/5 strength Normal gait. No pronator drift. No leg drop.   CN I, II and VIII not tested. CN II-XII grossly intact bilaterally.  Ambulatory while in the ED.   Skin:    General: Skin is  warm.  Neurological:     Mental Status: He is alert and oriented to person, place, and time.     ED Results / Procedures / Treatments   Labs (all labs ordered are listed, but only abnormal results are displayed) Labs Reviewed - No data to display  EKG None  Radiology DG Lumbar Spine 2-3 Views  Result Date: 01/24/2020 CLINICAL DATA:  Low back pain. EXAM: LUMBAR SPINE - 2-3 VIEW COMPARISON:  CT abdomen and pelvis 09/01/2019 FINDINGS: There are 5 non rib-bearing lumbar type vertebrae. Vertebral alignment is normal. Advanced disc degeneration at L5-S1 is similar to the prior CT including severe disc space narrowing, degenerative endplate sclerosis, and spurring. Milder disc space narrowing and spurring are noted at L4-5. IMPRESSION: Advanced L5-S1 disc degeneration.  No acute osseous abnormality. Electronically Signed   By: 09/03/2019 M.D.   On: 01/24/2020 10:52    Procedures Procedures (including critical care time)  Medications Ordered in ED Medications  diazepam (VALIUM) tablet 5 mg (5 mg Oral Given 01/24/20 1054)  acetaminophen (TYLENOL) tablet 1,000 mg (1,000 mg Oral Given 01/24/20 1054)  oxyCODONE (Oxy IR/ROXICODONE) immediate release tablet 5 mg (5 mg Oral Given 01/24/20 1054)  ketorolac (TORADOL) 15 MG/ML injection 15 mg (15 mg Intramuscular Given 01/24/20 1056)    ED Course  I have reviewed the triage vital signs and the nursing notes.  Pertinent labs & imaging results that were available during my care of the patient were reviewed by me and considered in my medical decision making (see chart for details).    MDM Rules/Calculators/A&P   Patient with a previous history of back injury back in 1994 presents to the ED with complaints of right lumbar spine pain with radiation to the right leg, no red flags such as fever, bowel or bladder incontinence, prior history of cancer, IV drug use.  Some suspicion for radiculopathy versus sciatica.  Will obtain x-Chett imaging as there  is no records on patient's file.  Vitals are within normal limits, he is afebrile.  Provided with back pain cocktail to help with pain control.  He is ambulatory in the ED without any evidence of foot drop, normal gait.  Xray of his lumbar spine showed: Advanced L5-S1 disc degeneration. No acute osseous abnormality.  11:45 AM Patient's pain reassessed, he reports improvement in his symptoms.  No prior history of diabetes, we discussed appropriate follow-up with orthopedics, he will go home with a short course of muscle relaxers along with steroids to help with his symptoms.  Prior history of gastric ulcers, deferred NSAID treatment at this time.  Patient understands and agrees to management.  Return precautions discussed at length.   Portions of this note were generated with 1995. Dictation errors may occur despite  best attempts at proofreading.  Final Clinical Impression(s) / ED Diagnoses Final diagnoses:  Acute right-sided low back pain with right-sided sciatica    Rx / DC Orders ED Discharge Orders         Ordered    methocarbamol (ROBAXIN) 500 MG tablet  2 times daily        01/24/20 1143    predniSONE (DELTASONE) 20 MG tablet  Daily        01/24/20 1200           Claude Manges, PA-C 01/24/20 1200    Derwood Kaplan, MD 01/26/20 [phone]

## 2020-01-30 ENCOUNTER — Other Ambulatory Visit: Payer: Self-pay

## 2020-01-30 ENCOUNTER — Emergency Department (HOSPITAL_COMMUNITY)
Admission: EM | Admit: 2020-01-30 | Discharge: 2020-01-30 | Disposition: A | Discharge status: Home / Self Care | Payer: Self-pay | Attending: Emergency Medicine | Admitting: Emergency Medicine

## 2020-01-30 ENCOUNTER — Encounter (HOSPITAL_COMMUNITY): Payer: Self-pay | Admitting: Emergency Medicine

## 2020-01-30 DIAGNOSIS — M5410 Radiculopathy, site unspecified: Secondary | ICD-10-CM

## 2020-01-30 DIAGNOSIS — F17200 Nicotine dependence, unspecified, uncomplicated: Secondary | ICD-10-CM | POA: Insufficient documentation

## 2020-01-30 DIAGNOSIS — M5416 Radiculopathy, lumbar region: Secondary | ICD-10-CM | POA: Insufficient documentation

## 2020-01-30 MED ORDER — OXYCODONE HCL 5 MG PO TABS: 5.0000 mg | ORAL_TABLET | Freq: Four times a day (QID) | ORAL | 0 refills | Status: AC | PRN

## 2020-01-30 MED ORDER — HYDROMORPHONE HCL 1 MG/ML IJ SOLN
1.0000 mg | Freq: Once | INTRAMUSCULAR | Status: AC
  Administered 2020-01-30: 12:00:00 1 mg via INTRAMUSCULAR
  Filled 2020-01-30: qty 1

## 2020-01-30 MED ORDER — DEXAMETHASONE SODIUM PHOSPHATE 10 MG/ML IJ SOLN
10.0000 mg | Freq: Once | INTRAMUSCULAR | Status: AC
  Administered 2020-01-30: 12:00:00 10 mg via INTRAMUSCULAR
  Filled 2020-01-30: qty 1

## 2020-01-30 MED ORDER — PREDNISONE 20 MG PO TABS: ORAL_TABLET | ORAL | 0 refills | Status: AC

## 2020-01-30 NOTE — ED Notes (Signed)
Patient given discharge instructions. Questions were answered. Patient verbalized understanding of discharge instructions and care at home.  

## 2020-01-30 NOTE — ED Provider Notes (Signed)
[facility] EMERGENCY DEPARTMENT Provider Note   CSN: [id number] Arrival date & time: 01/30/20  4098     History Chief Complaint  Patient presents with   Back Pain    Tristan Johnson is a 59 y.o. male.  Patient presents with ongoing right lower back pain and radiation into his right leg.  Patient was seen 6 days ago here in emergency department.  He had a negative lumbar spine film.  He was placed on prednisone and given Robaxin.  He states that the prednisone seemed to help but his pain worsened when he finished the course.  Patient has a orthopedic appointment scheduled on 02/03/2020.  No previous back surgeries. Patient denies warning symptoms of back pain including: fecal incontinence, urinary retention or overflow incontinence, night sweats, waking from sleep with back pain, unexplained fevers or weight loss, h/o cancer, IVDU, recent trauma.           Past Medical History:  Diagnosis Date   Gastric ulcer     There are no problems to display for this patient.   History reviewed. No pertinent surgical history.     No family history on file.  Social History   Tobacco Use   Smoking status: Current Every Day Smoker   Smokeless tobacco: Never Used  Substance Use Topics   Alcohol use: Yes   Drug use: Not Currently    Home Medications Prior to Admission medications   Medication Sig Start Date End Date Taking? Authorizing Provider  esomeprazole (NEXIUM) 20 MG capsule Take 20 mg by mouth daily at 12 noon.    [provider]  methocarbamol (ROBAXIN) 500 MG tablet Take 1 tablet (500 mg total) by mouth 2 (two) times daily for 10 days. 01/24/20 02/03/20  Claude Manges, PA-C  ondansetron (ZOFRAN ODT) 8 MG disintegrating tablet Take 1 tablet (8 mg total) by mouth every 8 (eight) hours as needed for nausea or vomiting. 02/06/19   Joy, Shawn C, PA-C  oxyCODONE-acetaminophen (PERCOCET) 5-325 MG tablet Take 1 tablet by mouth every 8 (eight) hours as  needed for severe pain. 09/01/19   Fayrene Helper, PA-C  tamsulosin (FLOMAX) 0.4 MG CAPS capsule Take 1 capsule (0.4 mg total) by mouth daily. 09/01/19   Fayrene Helper, PA-C    Allergies    Other, Tomato, Turkey-sweet potatoes-peaches [alimentum], and Ibuprofen  Review of Systems   Review of Systems  Constitutional: Negative for fever and unexpected weight change.  Gastrointestinal: Negative for constipation.       Neg for fecal incontinence  Genitourinary: Negative for difficulty urinating, flank pain and hematuria.       Negative for urinary incontinence or retention  Musculoskeletal: Positive for back pain.  Neurological: Negative for weakness and numbness.       Negative for saddle paresthesias     Physical Exam Updated Vital Signs BP (!) 152/101 (BP Location: Right Arm)    Pulse 79    Temp 98.7 F (37.1 C) (Oral)    Resp (!) 22    Ht 5\' 4"  (1.626 m)    Wt 75.8 kg    SpO2 100%    BMI 28.67 kg/m   Physical Exam Vitals and nursing note reviewed.  Constitutional:      General: He is in acute distress (Appears uncomfortable).     Appearance: He is well-developed and well-nourished.     Comments: Patient appears uncomfortable  HENT:     Head: Normocephalic and atraumatic.  Eyes:     Conjunctiva/sclera:  Conjunctivae normal.  Abdominal:     Palpations: Abdomen is soft.     Tenderness: There is no abdominal tenderness. There is no CVA tenderness.  Musculoskeletal:        General: Tenderness present. Normal range of motion.     Cervical back: Normal range of motion.     Comments: No step-off noted with palpation of spine.  Patient with tenderness along the lateral aspect of the lower lumbar spine on the right.  Skin:    General: Skin is warm and dry.  Neurological:     Mental Status: He is alert.     Sensory: No sensory deficit.     Motor: No abnormal muscle tone.     Deep Tendon Reflexes: Reflexes are normal and symmetric.     Comments: 5/5 strength in entire lower extremities  bilaterally. No sensation deficit.   Psychiatric:        Mood and Affect: Mood and affect normal.     ED Results / Procedures / Treatments   Labs (all labs ordered are listed, but only abnormal results are displayed) Labs Reviewed - No data to display  EKG None  Radiology No results found.  Procedures Procedures (including critical care time)  Medications Ordered in ED Medications  HYDROmorphone (DILAUDID) injection 1 mg (1 mg Intramuscular Given 01/30/20 1228)  dexamethasone (DECADRON) injection 10 mg (10 mg Intramuscular Given 01/30/20 1227)    ED Course  I have reviewed the triage vital signs and the nursing notes.  Pertinent labs & imaging results that were available during my care of the patient were reviewed by me and considered in my medical decision making (see chart for details).  Patient seen and examined.  Reviewed imaging from previous visit.  Patient would likely benefit from longer course of steroids.  Will give IM Dilaudid and Decadron for pain control, reassess.  If improved, plan for discharged home with orthopedic follow-up as planned.  Vital signs reviewed and are as follows: BP (!) 152/101 (BP Location: Right Arm)    Pulse 79    Temp 98.7 F (37.1 C) (Oral)    Resp (!) 22    Ht 5\' 4"  (1.626 m)    Wt 75.8 kg    SpO2 100%    BMI 28.67 kg/m   12:49 PM Patient feels better. Plan for d/c.   No red flag s/s of low back pain. Patient was counseled on back pain precautions and told to do activity as tolerated but do not lift, push, or pull heavy objects more than 10 pounds for the next week.  Patient counseled to use ice or heat on back for no longer than 15 minutes every hour.   Patient counseled on use of narcotic pain medications. Counseled not to combine these medications with others containing tylenol. Urged not to drink alcohol, drive, or perform any other activities that requires focus while taking these medications. The patient verbalizes understanding  and agrees with the plan.  Patient urged to follow-up with PCP if pain does not improve with treatment and rest or if pain becomes recurrent. Urged to return with worsening severe pain, loss of bowel or bladder control, trouble walking.   The patient verbalizes understanding and agrees with the plan.    MDM Rules/Calculators/A&P                          Patient with back pain with radicular features. No neurological deficits. Patient is ambulatory. No  warning symptoms of back pain including: fecal incontinence, urinary retention or overflow incontinence, night sweats, waking from sleep with back pain, unexplained fevers or weight loss, h/o cancer, IVDU, recent trauma. No concern for cauda equina, epidural abscess, or other serious cause of back pain. Conservative measures such as rest, ice/heat and pain medicine indicated with ortho follow-up as scheduled.    Final Clinical Impression(s) / ED Diagnoses Final diagnoses:  Back pain with radiculopathy    Rx / DC Orders ED Discharge Orders         Ordered    predniSONE (DELTASONE) 20 MG tablet        01/30/20 1252    oxyCODONE (OXY IR/ROXICODONE) 5 MG immediate release tablet  Every 6 hours PRN        01/30/20 1252           Renne Crigler, PA-C 01/30/20 1253    Benjiman Core, MD 01/30/20 1537

## 2020-01-30 NOTE — Discharge Instructions (Signed)
Please read and follow all provided instructions.  Your diagnoses today include:  1. Back pain with radiculopathy     Tests performed today include:  Vital signs - see below for your results today  Medications prescribed:   Oxycodone - narcotic pain medication  DO NOT drive or perform any activities that require you to be awake and alert because this medicine can make you drowsy.    Prednisone - steroid medicine   It is best to take this medication in the morning to prevent sleeping problems. If you are diabetic, monitor your blood sugar closely and stop taking Prednisone if blood sugar is over 300. Take with food to prevent stomach upset.   Take any prescribed medications only as directed.  Home care instructions:   Follow any educational materials contained in this packet  Please rest, use ice or heat on your back for the next several days  Do not lift, push, pull anything more than 10 pounds for the next week  Follow-up instructions: Please follow-up with your orthopedist on Thursday as planned.   Return instructions:  SEEK IMMEDIATE MEDICAL ATTENTION IF YOU HAVE:  New numbness, tingling, weakness, or problem with the use of your arms or legs  Severe back pain not relieved with medications  Loss control of your bowels or bladder  Increasing pain in any areas of the body (such as chest or abdominal pain)  Shortness of breath, dizziness, or fainting.   Worsening nausea (feeling sick to your stomach), vomiting, fever, or sweats  Any other emergent concerns regarding your health   Additional Information:  Your vital signs today were: BP (!) 152/101 (BP Location: Right Arm)   Pulse 79   Temp 98.7 F (37.1 C) (Oral)   Resp (!) 22   Ht 5\' 4"  (1.626 m)   Wt 75.8 kg   SpO2 100%   BMI 28.67 kg/m  If your blood pressure (BP) was elevated above 135/85 this visit, please have this repeated by your doctor within one month. --------------

## 2020-01-30 NOTE — ED Triage Notes (Signed)
C/o lower back pain that radiates to R hip and leg x 1 month.  States he was seen here last Monday for same and is scheduled to see orthopedist on 12/23.

## 2021-06-05 ENCOUNTER — Emergency Department (HOSPITAL_COMMUNITY): Payer: Worker's Compensation

## 2021-06-05 ENCOUNTER — Emergency Department (HOSPITAL_COMMUNITY)
Admission: EM | Admit: 2021-06-05 | Discharge: 2021-06-05 | Disposition: A | Discharge status: Home / Self Care | Payer: Worker's Compensation | Attending: Emergency Medicine | Admitting: Emergency Medicine

## 2021-06-05 DIAGNOSIS — W208XXA Other cause of strike by thrown, projected or falling object, initial encounter: Secondary | ICD-10-CM | POA: Diagnosis not present

## 2021-06-05 DIAGNOSIS — S9031XA Contusion of right foot, initial encounter: Secondary | ICD-10-CM | POA: Diagnosis not present

## 2021-06-05 DIAGNOSIS — S99921A Unspecified injury of right foot, initial encounter: Secondary | ICD-10-CM | POA: Diagnosis present

## 2021-06-05 NOTE — Discharge Instructions (Signed)
Take Tylenol as needed as directed.  Apply ice for 20 minutes at a time and elevate to help with pain and swelling.  Recheck with your Worker's Comp. provider. ?

## 2021-06-05 NOTE — ED Provider Notes (Signed)
?[facility] EMERGENCY DEPARTMENT ?Provider Note ? ? ?CSN: [id number] ?Arrival date & time: 06/05/21  0848 ? ?  ? ?History ? ?Chief Complaint  ?Patient presents with  ? Toe Injury  ? ? ?Pradeep Heigl is a 61 y.o. male. ? ?61 year old male presents with complaint of pain in his right foot.  Patient states that he was at work on Friday when he was lifting a box full of soda syrup, the box broke open and the 50 pound package of soda syrup fell onto his right foot.  Patient was wearing a shoe at the time.  He did not seek any care at that time.  Reports pain with ambulation, swelling in his foot. ? ? ?  ? ?Home Medications ?Prior to Admission medications   ?Medication Sig Start Date End Date Taking? Authorizing Provider  ?esomeprazole (NEXIUM) 20 MG capsule Take 20 mg by mouth daily at 12 noon.    [provider]  ?oxyCODONE (OXY IR/ROXICODONE) 5 MG immediate release tablet Take 1 tablet (5 mg total) by mouth every 6 (six) hours as needed for severe pain. 01/30/20   Renne Crigler, PA-C  ?predniSONE (DELTASONE) 20 MG tablet 3 Tabs PO Days 1-3, then 2 tabs PO Days 4-6, then 1 tab PO Day 7-9, then Half Tab PO Day 10-12 01/30/20   Renne Crigler, PA-C  ?   ? ?Allergies    ?Other, Tomato, Turkey-sweet potatoes-peaches [alimentum], and Ibuprofen   ? ?Review of Systems   ?Review of Systems ?Negative except as per HPI ?Physical Exam ?Updated Vital Signs ?BP (!) 155/99   Pulse 80   Temp 98.4 ?F (36.9 ?C) (Oral)   Resp 16   SpO2 100%  ?Physical Exam ?Vitals and nursing note reviewed.  ?Constitutional:   ?   General: He is not in acute distress. ?   Appearance: He is well-developed. He is not diaphoretic.  ?HENT:  ?   Head: Normocephalic and atraumatic.  ?Cardiovascular:  ?   Pulses: Normal pulses.  ?Pulmonary:  ?   Effort: Pulmonary effort is normal.  ?Musculoskeletal:     ?   General: Swelling and tenderness present. No deformity.  ?   Comments: Right foot with mild to moderate swelling, generalized  tenderness to the foot, skin intact, no erythema, DP pulse present, sensation intact in each toe  ?Skin: ?   General: Skin is warm and dry.  ?   Findings: No erythema or rash.  ?Neurological:  ?   Mental Status: He is alert and oriented to person, place, and time.  ?   Sensory: No sensory deficit.  ?   Motor: No weakness.  ?   Gait: Gait abnormal.  ?   Comments: Limp favoring right foot  ?Psychiatric:     ?   Behavior: Behavior normal.  ? ? ?ED Results / Procedures / Treatments   ?Labs ?(all labs ordered are listed, but only abnormal results are displayed) ?Labs Reviewed - No data to display ? ?EKG ?None ? ?Radiology ?DG Toe 4th Right ? ?Result Date: 06/05/2021 ?CLINICAL DATA:  Fourth toe trauma 4 days ago.  Pain and swelling. EXAM: RIGHT FOURTH TOE COMPARISON:  None FINDINGS: The joint spaces are maintained.  No acute fracture. IMPRESSION: No acute bony findings. Electronically Signed   By: Rudie Meyer M.D.   On: 06/05/2021 09:17   ? ?Procedures ?Procedures  ? ? ?Medications Ordered in ED ?Medications - No data to display ? ?ED Course/ Medical Decision Making/ A&P ?  ?                        ?  Medical Decision Making ?Amount and/or Complexity of Data Reviewed ?Radiology: ordered. ? ? ?61 year old male with right foot injury as above, found to have generalized tenderness to the foot, no specific bone tenderness, sensation intact, DP pulse present.  Patient with x-Ladavion negative for fracture.  Plan is to place in postop shoe.  Recommend Tylenol, ice and elevate, follow-up with Worker's Comp. provider. ? ? ? ? ? ? ? ?Final Clinical Impression(s) / ED Diagnoses ?Final diagnoses:  ?Contusion of right foot, initial encounter  ? ? ?Rx / DC Orders ?ED Discharge Orders   ? ? None  ? ?  ? ? ?  ?Jeannie Fend, PA-C ?06/05/21 7989 ? ?  ?Jacalyn Lefevre, MD ?06/05/21 1043 ? ?

## 2021-06-05 NOTE — ED Triage Notes (Signed)
Pt. Stated, A box fell on my rt. Toe next to the little toe on Friday ?

## 2022-02-08 IMAGING — CT CT RENAL STONE PROTOCOL
2 of 4 series · 16 of 46 positions shown, 18 images · non-contrast
Comparison: Scrotal ultrasound 09/01/2019, CT 02/06/2019

CLINICAL DATA: Flank and left testicle pain

EXAM:
CT ABDOMEN AND PELVIS WITHOUT CONTRAST
TECHNIQUE: Multidetector CT imaging of the abdomen and pelvis was performed
following the standard protocol without IV contrast.

[Series 3: abd/ pelvis 5.0 i30f 2 · axial · 0.73mm/px · z∈[+770,+1136]mm · 13 of 81 slices shown, 15 images]
[im 4/81  soft-tissue]
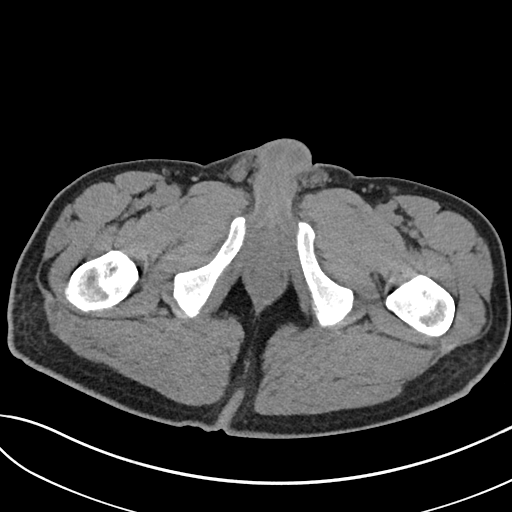
[im 4/81  bone]
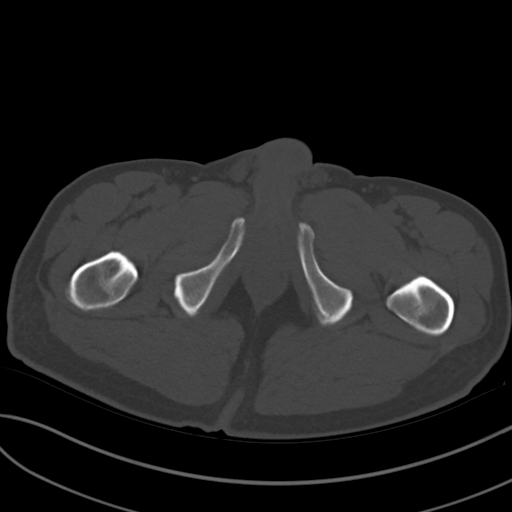
[im 11/81  soft-tissue]
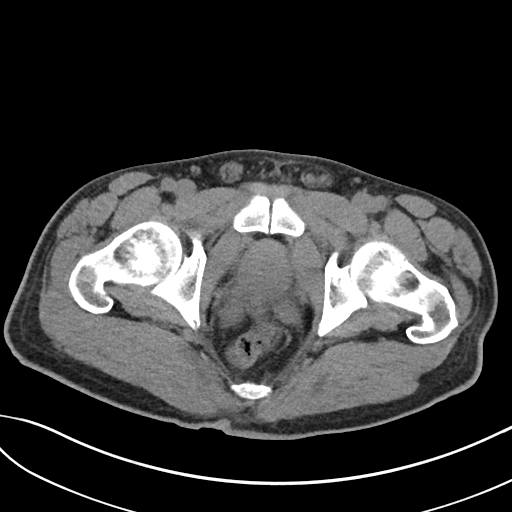
[im 18/81  soft-tissue]
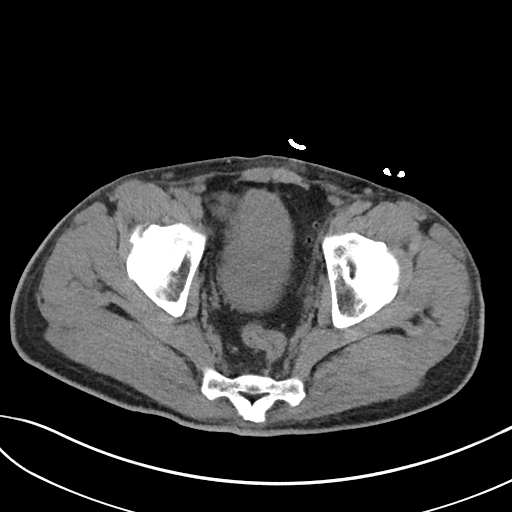
[im 21/81  soft-tissue]
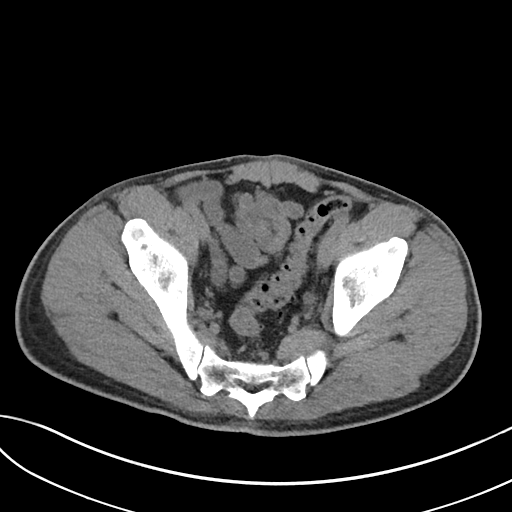
[im 28/81  soft-tissue]
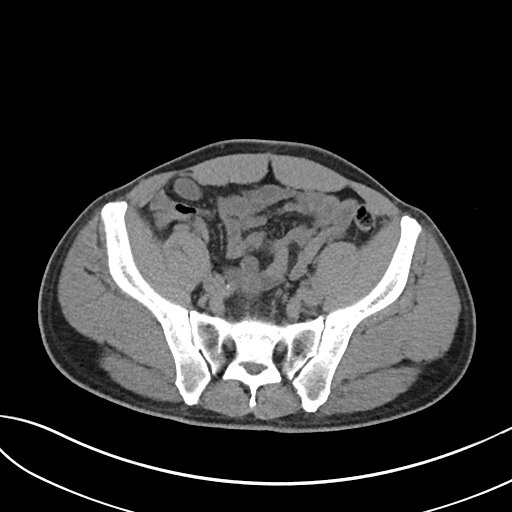
[im 35/81  soft-tissue]
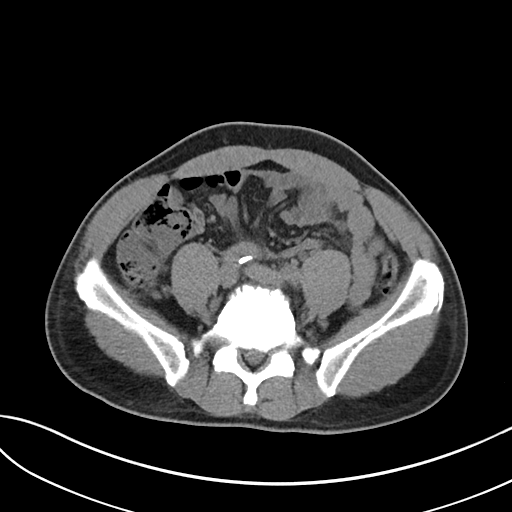
[im 42/81  soft-tissue]
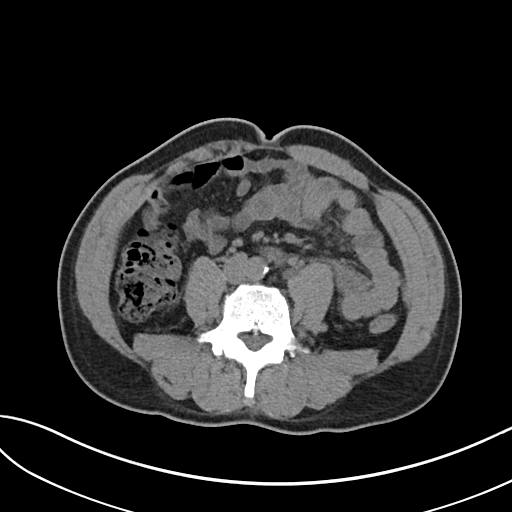
[im 46/81  soft-tissue]
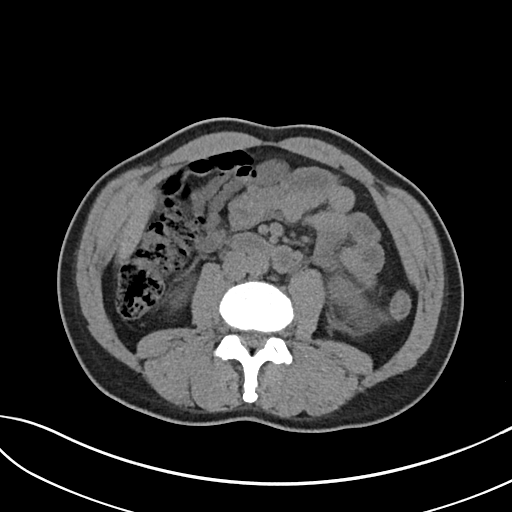
[im 53/81  soft-tissue]
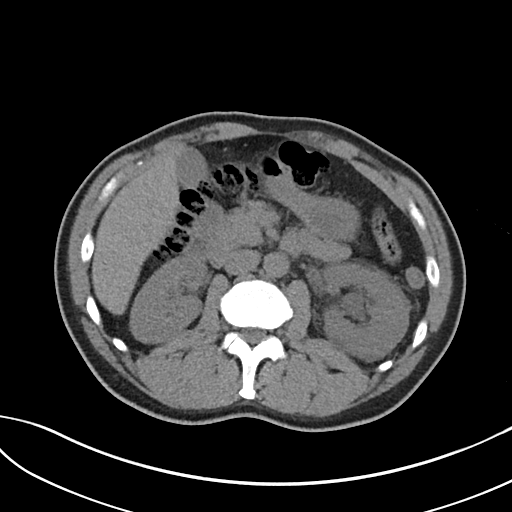
[im 53/81  bone]
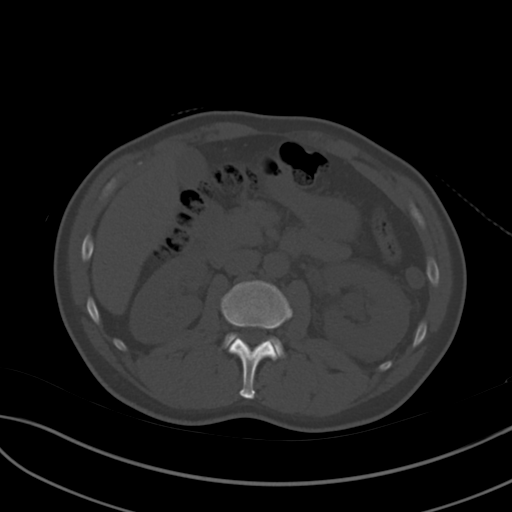
[im 60/81  soft-tissue]
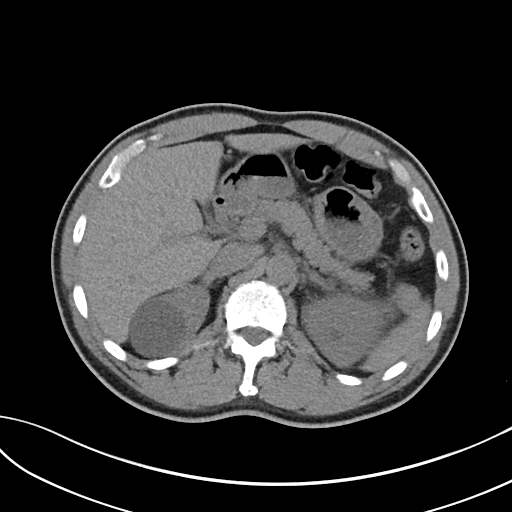
[im 63/81  soft-tissue]
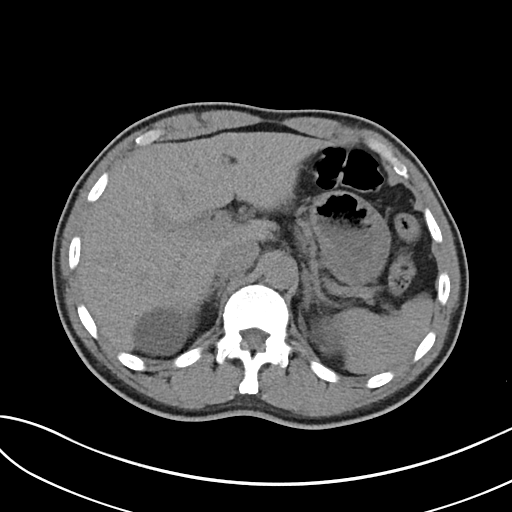
[im 70/81  soft-tissue]
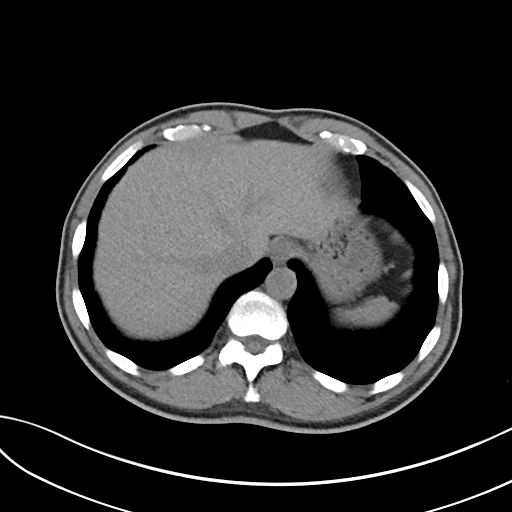
[im 77/81  soft-tissue]
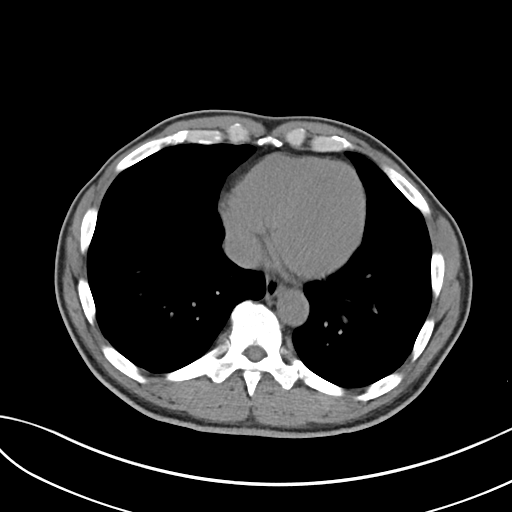

[Series 6: cor st · coronal · 0.64mm/px · 3 of 92 slices shown]
[im 31/92  soft-tissue]
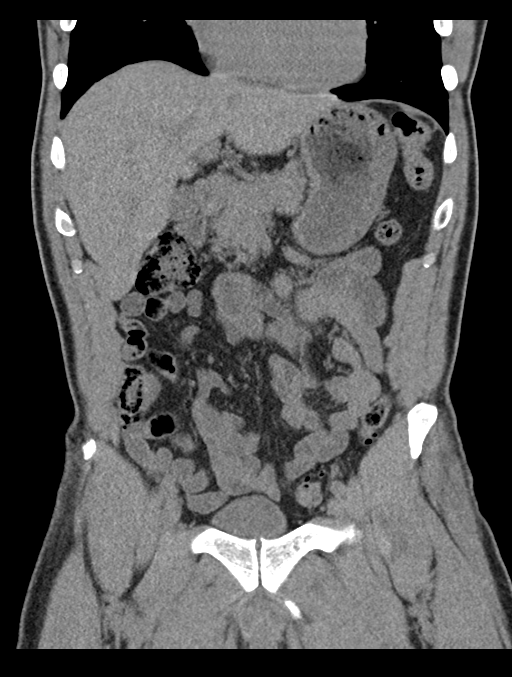
[im 41/92  soft-tissue]
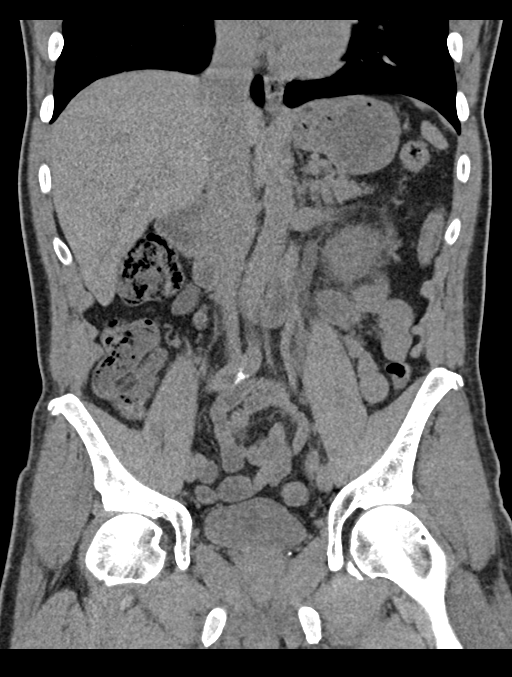
[im 51/92  soft-tissue]
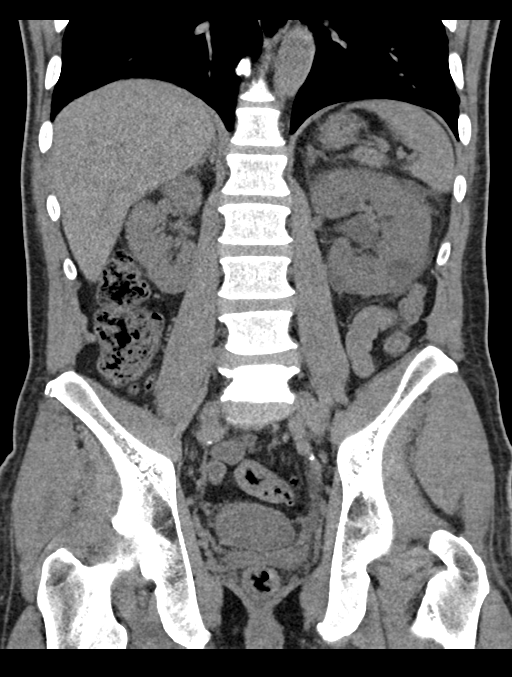

[16 of 46 positions shown; findings below may reference images not displayed]

FINDINGS: Lower chest: No acute abnormality.

Hepatobiliary: Small cyst in the left hepatic lobe. No calcified
gallstone or biliary dilatation.

Pancreas: Unremarkable. No pancreatic ductal dilatation or
surrounding inflammatory changes.

Spleen: Normal in size without focal abnormality.

Adrenals/Urinary Tract: Adrenal glands are normal. Low-attenuation
lesions in both kidneys likely related to cysts with additional
subcentimeter hypodensities too small to further characterize. Mild
left hydronephrosis and hydroureter, secondary to a 7 x 10 mm stone
in the distal left ureter at or just proximal to the left UVJ. Mild
left perinephric fat stranding. The bladder is normal.

Stomach/Bowel: Stomach is within normal limits. Appendix appears
normal. No evidence of bowel wall thickening, distention, or
inflammatory changes. Left colon diverticular disease without acute
inflammatory change.

Vascular/Lymphatic: Mild aortic atherosclerosis. No aneurysm. No
suspicious nodes.

Reproductive: Prostate is unremarkable.

Other: Negative for free air or free fluid.

Musculoskeletal: Advanced degenerative change at L5-S1 with endplate
sclerosis, disc space narrowing and osteophyte.
IMPRESSION: 1. Mild left hydronephrosis and hydroureter, secondary to a 7 x 10
mm stone in the distal left ureter at or just proximal to the left
UVJ.
2. Left colon diverticular disease without acute inflammatory
change.

Aortic Atherosclerosis ([id number].T).

## 2022-04-13 ENCOUNTER — Ambulatory Visit: Payer: Medicaid Other | Attending: Nurse Practitioner | Admitting: Physical Therapy

## 2022-04-13 ENCOUNTER — Encounter: Payer: Self-pay | Admitting: Physical Therapy

## 2022-04-13 ENCOUNTER — Other Ambulatory Visit: Payer: Self-pay

## 2022-04-13 DIAGNOSIS — M6281 Muscle weakness (generalized): Secondary | ICD-10-CM | POA: Insufficient documentation

## 2022-04-13 DIAGNOSIS — R2689 Other abnormalities of gait and mobility: Secondary | ICD-10-CM | POA: Insufficient documentation

## 2022-04-13 DIAGNOSIS — M5459 Other low back pain: Secondary | ICD-10-CM | POA: Insufficient documentation

## 2022-04-13 NOTE — Therapy (Signed)
OUTPATIENT PHYSICAL THERAPY THORACOLUMBAR EVALUATION  Patient Name: Tristan Johnson MRN: [id number] DOB:Jun 02, 1960, 62 y.o., male Today's Date: 04/13/2022   PT End of Session - 04/13/22 1119     Visit Number 1    Number of Visits --   1-2x/week   Date for PT Re-Evaluation 06/08/22    Authorization Type [employer] - ODI    PT Start Time 1030    PT Stop Time 1109    PT Time Calculation (min) 39 min             Past Medical History:  Diagnosis Date   Gastric ulcer    History reviewed. No pertinent surgical history. There are no problems to display for this patient.   PCP: Pcp, No  REFERRING PROVIDER: Oval Linsey, NP  THERAPY DIAG:  Other low back pain  Muscle weakness  Other abnormalities of gait and mobility  REFERRING DIAG: Difficulty in walking, not elsewhere classified [R26.2], Sciatica, unspecified side [M54.30], Pain in right leg [M79.604], Pain in right hip [M25.551]   Rationale for Evaluation and Treatment:  Rehabilitation  SUBJECTIVE:  PERTINENT PAST HISTORY:  Stomach ulcers        PRECAUTIONS: None  WEIGHT BEARING RESTRICTIONS No  FALLS:  Has patient fallen in last 6 months? No, Number of falls: 0  MOI/History of condition:  Onset date: [place] is a 62 y.o. male who presents to clinic with chief complaint of R>L low back and R hip pain which started when he fell down an embankment and hit a tree "jarring" his spine in 1994.  He endorses some radiating pain from the R lumbar region and R lateral hip to the anterior thigh and R knee.  He also has L knee pain.  Endorses R>L sided LE weakness.  He endorses some N/T in his R LE to the foot.  He lives alone.  I significantly debilitated by his pain and weakness.   Red flags:  denies BB changes and saddle anesthesia  Pain:  Are you having pain? Yes Pain location: R>L lumbar spine, R hip, R and L knee NPRS scale:  5/10 to 10/10 Aggravating factors: walking (2  min before break), moving from sitting to standing Relieving factors: rest with spine and hip in neutral position Pain description: constant, sharp, stabbing, and shooting Stage: Chronic Stability: getting worse 24 hour pattern: worse first thing in the morning   Occupation: works Nurse, learning disability: [place]: NA  Patient Goals/Specific Activities: reduce pain, improve mobility   OBJECTIVE:   GENERAL OBSERVATION/GAIT:  Highly antalgic gait with reduced speed, step height and step length.  Forward flexed posture in gait  SENSATION:  Light touch: Appears intact  LUMBAR AROM  AROM AROM  04/13/2022  Flexion Fingertips to mid thighs (limited by ~75%), w/ concordant pain Gower's sign  Extension limited by >75%, w/ concordant pain  Right lateral flexion limited by >75%, w/ concordant pain  Left lateral flexion limited by >75%, w/ concordant pain  Right rotation limited by >75%, w/ concordant pain  Left rotation limited by >75%, w/ concordant pain    (Blank rows = not tested)   LE MMT:  MMT Right 04/13/2022 Left 04/13/2022  Hip flexion (L2, L3) 2+ 3+  Knee extension (L3) 3- 3+  Knee flexion 3- 3+  Hip abduction    Hip extension    Hip external rotation    Hip internal rotation    Hip adduction  Ankle dorsiflexion (L4) 3 4  Ankle plantarflexion (S1) 2+ 3+  Ankle inversion    Ankle eversion    Great Toe ext (L5) c c  Grossly     (Blank rows = not tested, score listed is out of 5 possible points.  N = WNL, D = diminished, C = clear for gross weakness with myotome testing, * = concordant pain with testing)   SPECIAL TESTS:  Straight leg raise: L (-), R (+) Slump: L (-), R (+) FADIR, FABER, SCOUR all (+) but unable to differentiate hip from low back pain  MUSCLE LENGTH: Hamstrings: Right significant restriction; Left subtle restriction ASLR: Right ASLR significantly reduced compared to PSLR ; Left ASLR significantly reduced  compared to PSLR    LE ROM:  ROM Right 04/13/2022 Left 04/13/2022  Hip flexion Limited to 90 degrees d/t pain Limited to 90 degrees d/t pain  Hip extension    Hip abduction    Hip adduction    Hip internal rotation    Hip external rotation    Knee flexion 90* 105  Knee extension Lacking 5 Lacking 2  Ankle dorsiflexion    Ankle plantarflexion    Ankle inversion    Ankle eversion      (Blank rows = not tested, N = WNL, * = concordant pain with testing)  Functional Tests  Eval (04/13/2022)    10 m max gait speed: 15'', .67 m/s, AD: none                                                            PALPATION:   Significant TTP R>L lumbar spine, R hip, R>L knee  PATIENT SURVEYS:  Modified Oswestry 31/50    TODAY'S TREATMENT  Creating, reviewing, and completing below HEP  PATIENT EDUCATION:  POC, diagnosis, prognosis, HEP, and outcome measures.  Pt educated via explanation, demonstration, and handout (HEP).  Pt confirms understanding verbally.   HOME EXERCISE PROGRAM: Access Code: [email or web address] URL: [email or web address] Date: 04/13/2022 Prepared by: Shearon Balo  Exercises - Seated Hip Abduction with Resistance  - 1 x daily - 7 x weekly - 3 sets - 10 reps - Seated Hip Adduction Isometrics with Ball  - 1 x daily - 7 x weekly - 1 sets - 10 reps - 10 hold - Seated March with Resistance  - 1 x daily - 7 x weekly - 3 sets - 10 reps  Treatment priorities   Eval (04/13/2022)        Gentle exercise as tolerated                                          ASSESSMENT:  CLINICAL IMPRESSION: Tristan Johnson is a 62 y.o. male who presents to clinic with signs and sxs consistent with severe low back pain with neural involvement.  Possible concurrent hip and knee OA, but this is difficult to parse out d/t extreme pain in low back with any LE movement beyond neutral.  He is in a very difficult situation at this point.  He is not interested in surgery.  Not interested  in aquatics.  He has not been to an ortho or spine specialist in some time and would likely  benefit from this, but has little interest in this at this point.  We can trial PT and hopefully get a referral to a specialist.  OBJECTIVE IMPAIRMENTS: Pain, knee ROM, hip ROM, lumbar ROM, LE and core strength, gait, balance  ACTIVITY LIMITATIONS: bending, squatting, walking, lifting, working, standing  PERSONAL FACTORS: See medical history and pertinent history   REHAB POTENTIAL: Poor Chronic for almost 40 years of severe pain  CLINICAL DECISION MAKING: Stable/uncomplicated  EVALUATION COMPLEXITY: Low   GOALS:   SHORT TERM GOALS: Target date: 05/11/2022  Tristan Johnson will be >75% HEP compliant to improve carryover between sessions and facilitate independent management of condition  Evaluation (04/13/2022): ongoing Goal status: INITIAL   LONG TERM GOALS: Target date: 06/08/2022  Tristan Johnson will show a >/= 12 pt improvement in their ODI score (MCID is 12 pts) as a proxy for functional improvement   Evaluation/Baseline (04/13/2022): 31/50 pts Goal status: INITIAL   2.  Tristan Johnson will be able to walk for 15 min, not limited by pain to allow easier completion of ADLs such as grocery shopping  Evaluation/Baseline (04/13/2022): 2 min Goal status: INITIAL   3.  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  Tristan Johnson will self report >/= 50% decrease in pain from evaluation   Evaluation/Baseline (04/13/2022): 10/10 max pain Goal status: INITIAL   PLAN: PT FREQUENCY: 1-2x/week  PT DURATION: 8 weeks (Ending 06/08/2022)  PLANNED INTERVENTIONS: Therapeutic exercises, Aquatic therapy, Therapeutic activity, Neuro Muscular re-education, Gait training, Patient/Family education, Joint mobilization, Dry Needling, Electrical stimulation, Spinal mobilization and/or manipulation, Moist heat,  Taping, Vasopneumatic device, Ionotophoresis '4mg'$ /ml Dexamethasone, and Manual therapy     Shearon Balo PT, DPT 04/13/2022, 11:20 AM

## 2022-04-27 ENCOUNTER — Ambulatory Visit: Payer: Medicaid Other

## 2022-05-04 ENCOUNTER — Ambulatory Visit: Payer: Medicaid Other | Admitting: Physical Therapy

## 2023-11-13 IMAGING — CR DG TOE 4TH 2+V*R*
3 series · 3 of 3 positions shown · non-contrast
Comparison: None

CLINICAL DATA: Fourth toe trauma 4 days ago.  Pain and swelling.

EXAM:
RIGHT FOURTH TOE

[toe ap]
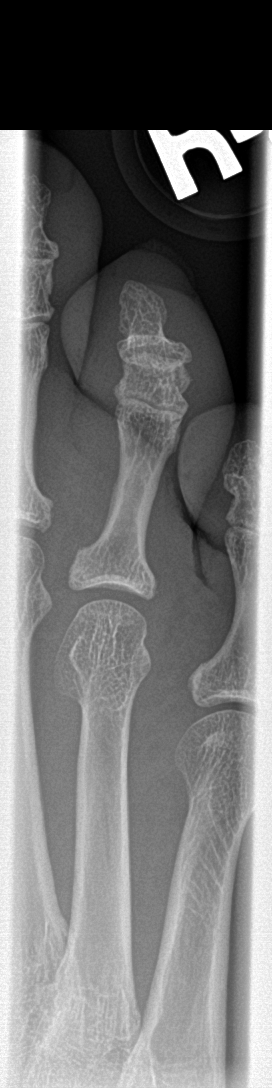

[toe obl]
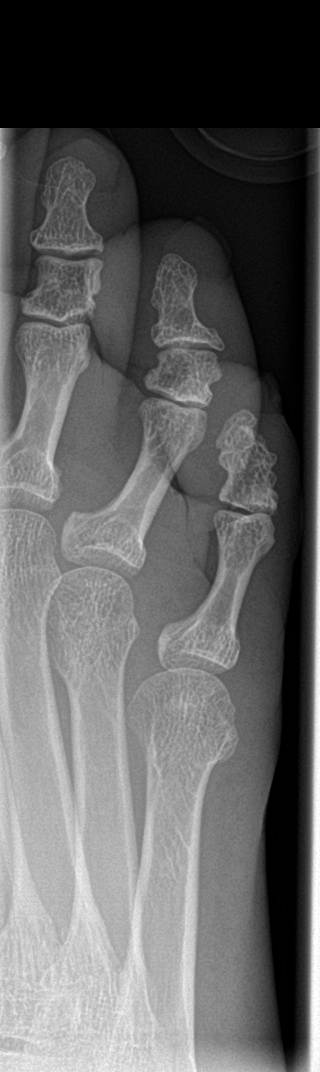

[toe lat]
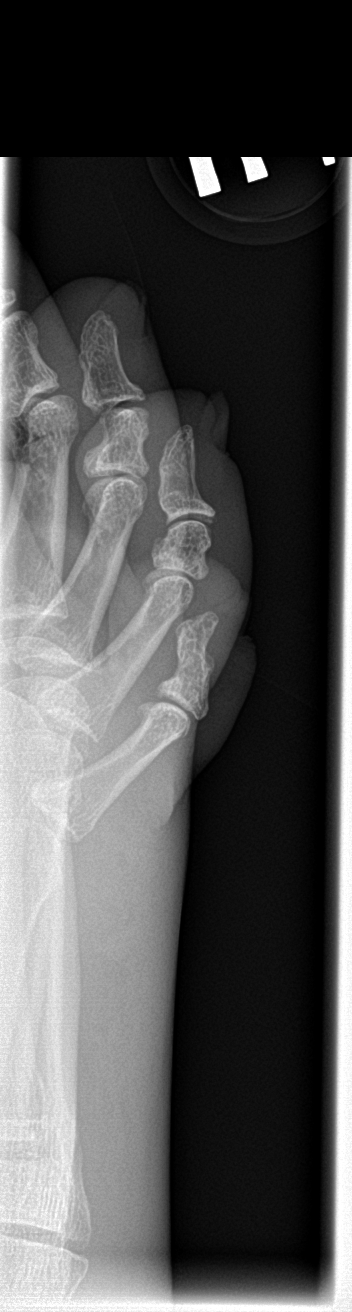

[3 of 3 positions shown; findings below may reference images not displayed]

FINDINGS: The joint spaces are maintained.  No acute fracture.
IMPRESSION: No acute bony findings.
